# Patient Record
Sex: Female | Born: 1975
Health system: Southern US, Community
[De-identification: ages and names within clinical notes are randomized; demographics above are authoritative.]

## PROBLEM LIST (undated history)

## (undated) ENCOUNTER — Inpatient Hospital Stay (HOSPITAL_COMMUNITY): Payer: Self-pay

## (undated) DIAGNOSIS — I341 Nonrheumatic mitral (valve) prolapse: Secondary | ICD-10-CM

## (undated) DIAGNOSIS — R011 Cardiac murmur, unspecified: Secondary | ICD-10-CM

## (undated) DIAGNOSIS — T7840XA Allergy, unspecified, initial encounter: Secondary | ICD-10-CM

## (undated) DIAGNOSIS — S92919A Unspecified fracture of unspecified toe(s), initial encounter for closed fracture: Secondary | ICD-10-CM

## (undated) HISTORY — DX: Cardiac murmur, unspecified: R01.1

## (undated) HISTORY — DX: Allergy, unspecified, initial encounter: T78.40XA

## (undated) HISTORY — DX: Unspecified fracture of unspecified toe(s), initial encounter for closed fracture: S92.919A

## (undated) HISTORY — PX: WISDOM TOOTH EXTRACTION: SHX21

---

## 2005-10-20 ENCOUNTER — Other Ambulatory Visit: Admission: RE | Admit: 2005-10-20 | Discharge: 2005-10-20 | Payer: Self-pay | Admitting: Obstetrics and Gynecology

## 2009-12-18 ENCOUNTER — Inpatient Hospital Stay (HOSPITAL_COMMUNITY): Admission: AD | Admit: 2009-12-18 | Discharge: 2009-12-21 | Payer: Self-pay | Admitting: Obstetrics and Gynecology

## 2010-01-13 ENCOUNTER — Emergency Department (HOSPITAL_BASED_OUTPATIENT_CLINIC_OR_DEPARTMENT_OTHER): Admission: EM | Admit: 2010-01-13 | Discharge: 2010-01-13 | Payer: Self-pay | Admitting: Emergency Medicine

## 2010-02-12 ENCOUNTER — Encounter: Admission: RE | Admit: 2010-02-12 | Discharge: 2010-03-14 | Payer: Self-pay | Admitting: Obstetrics and Gynecology

## 2010-03-15 ENCOUNTER — Encounter: Admission: RE | Admit: 2010-03-15 | Discharge: 2010-04-14 | Payer: Self-pay | Admitting: Obstetrics and Gynecology

## 2010-04-15 ENCOUNTER — Encounter: Admission: RE | Admit: 2010-04-15 | Discharge: 2010-05-15 | Payer: Self-pay | Admitting: Obstetrics and Gynecology

## 2010-05-16 ENCOUNTER — Encounter: Admission: RE | Admit: 2010-05-16 | Discharge: 2010-06-15 | Payer: Self-pay | Admitting: Obstetrics and Gynecology

## 2010-06-16 ENCOUNTER — Encounter: Admission: RE | Admit: 2010-06-16 | Discharge: 2010-06-30 | Payer: Self-pay | Admitting: Obstetrics and Gynecology

## 2010-07-17 ENCOUNTER — Encounter: Admission: RE | Admit: 2010-07-17 | Discharge: 2010-08-16 | Payer: Self-pay | Admitting: Obstetrics and Gynecology

## 2010-08-17 ENCOUNTER — Encounter
Admission: RE | Admit: 2010-08-17 | Discharge: 2010-09-16 | Payer: Self-pay | Source: Home / Self Care | Admitting: Obstetrics and Gynecology

## 2010-09-17 ENCOUNTER — Encounter
Admission: RE | Admit: 2010-09-17 | Discharge: 2010-09-28 | Payer: Self-pay | Source: Home / Self Care | Attending: Obstetrics and Gynecology | Admitting: Obstetrics and Gynecology

## 2010-12-30 LAB — DIFFERENTIAL
Basophils Absolute: 0 10*3/uL (ref 0.0–0.1)
Basophils Relative: 0 % (ref 0–1)
Monocytes Absolute: 0.2 10*3/uL (ref 0.1–1.0)
Neutro Abs: 7.1 10*3/uL (ref 1.7–7.7)
Neutrophils Relative %: 82 % — ABNORMAL HIGH (ref 43–77)

## 2010-12-30 LAB — URINE MICROSCOPIC-ADD ON

## 2010-12-30 LAB — URINALYSIS, ROUTINE W REFLEX MICROSCOPIC
Bilirubin Urine: NEGATIVE
Hgb urine dipstick: NEGATIVE
Nitrite: NEGATIVE
Specific Gravity, Urine: 1.017 (ref 1.005–1.030)
Urobilinogen, UA: 0.2 mg/dL (ref 0.0–1.0)
pH: 8 (ref 5.0–8.0)

## 2010-12-30 LAB — LIPASE, BLOOD: Lipase: 101 U/L (ref 23–300)

## 2010-12-30 LAB — CBC
HCT: 35.5 % — ABNORMAL LOW (ref 36.0–46.0)
Hemoglobin: 11.7 g/dL — ABNORMAL LOW (ref 12.0–15.0)
MCV: 79.9 fL (ref 78.0–100.0)
WBC: 8.6 10*3/uL (ref 4.0–10.5)

## 2010-12-30 LAB — COMPREHENSIVE METABOLIC PANEL
Alkaline Phosphatase: 78 U/L (ref 39–117)
BUN: 11 mg/dL (ref 6–23)
CO2: 25 mEq/L (ref 19–32)
Chloride: 109 mEq/L (ref 96–112)
Creatinine, Ser: 0.8 mg/dL (ref 0.4–1.2)
GFR calc non Af Amer: >60 mL/min (ref >60)
Glucose, Bld: 101 mg/dL — ABNORMAL HIGH (ref 70–99)
Potassium: 4.1 mEq/L (ref 3.5–5.1)
Total Bilirubin: 0.3 mg/dL (ref 0.3–1.2)

## 2010-12-30 LAB — URINE CULTURE

## 2011-01-04 LAB — RPR: RPR Ser Ql: NONREACTIVE

## 2011-01-04 LAB — CBC
HCT: 27.8 % — ABNORMAL LOW (ref 36.0–46.0)
MCHC: 33.2 g/dL (ref 30.0–36.0)
Platelets: 172 10*3/uL (ref 150–400)
RBC: 3.38 MIL/uL — ABNORMAL LOW (ref 3.87–5.11)
RBC: 4.83 MIL/uL (ref 3.87–5.11)
WBC: 14.6 10*3/uL — ABNORMAL HIGH (ref 4.0–10.5)
WBC: 9.1 10*3/uL (ref 4.0–10.5)

## 2011-06-18 ENCOUNTER — Inpatient Hospital Stay (HOSPITAL_COMMUNITY): Payer: BC Managed Care – PPO

## 2011-06-18 ENCOUNTER — Inpatient Hospital Stay (HOSPITAL_COMMUNITY)
Admission: AD | Admit: 2011-06-18 | Discharge: 2011-06-18 | Disposition: A | Discharge status: Home / Self Care | Payer: BC Managed Care – PPO | Source: Ambulatory Visit | Attending: Family Medicine | Admitting: Family Medicine

## 2011-06-18 ENCOUNTER — Encounter (HOSPITAL_COMMUNITY): Payer: Self-pay | Admitting: *Deleted

## 2011-06-18 DIAGNOSIS — O200 Threatened abortion: Secondary | ICD-10-CM

## 2011-06-18 DIAGNOSIS — I059 Rheumatic mitral valve disease, unspecified: Secondary | ICD-10-CM | POA: Insufficient documentation

## 2011-06-18 DIAGNOSIS — I2510 Atherosclerotic heart disease of native coronary artery without angina pectoris: Secondary | ICD-10-CM | POA: Insufficient documentation

## 2011-06-18 HISTORY — DX: Nonrheumatic mitral (valve) prolapse: I34.1

## 2011-06-18 LAB — WET PREP, GENITAL
Trich, Wet Prep: NONE SEEN
Yeast Wet Prep HPF POC: NONE SEEN

## 2011-06-18 LAB — CBC
MCH: 27.5 pg (ref 26.0–34.0)
MCV: 80.2 fL (ref 78.0–100.0)
Platelets: 180 10*3/uL (ref 150–400)
RDW: 13.4 % (ref 11.5–15.5)
WBC: 5.8 10*3/uL (ref 4.0–10.5)

## 2011-06-18 NOTE — ED Provider Notes (Signed)
History     Chief Complaint  Patient presents with  . Vaginal Bleeding   HPI  Pt here with report of cramping that started yesterday.  Denies unilateral pain.  +discharge, with blood specks seen, no active bleeding.    Past Medical History  Diagnosis Date  . Mitral valve prolapse     Past Surgical History  Procedure Date  . Wisdom tooth extraction     No family history on file.  History  Substance Use Topics  . Smoking status: Never Smoker   . Smokeless tobacco: Never Used  . Alcohol Use: No    Allergies: No Known Allergies  Prescriptions prior to admission  Medication Sig Dispense Refill  . Flaxseed, Linseed, (FLAX SEED OIL PO) Take 1 tablet by mouth daily.        . Multiple Vitamin (MULTIVITAMIN) capsule Take 1 capsule by mouth daily.          Review of Systems  Constitutional: Negative.   Gastrointestinal: Positive for abdominal pain (cramping). Negative for nausea and vomiting.       Blood tinged discharge  Genitourinary: Negative.    Physical Exam   Blood pressure 123/69, pulse 78, temperature 97.8 F (36.6 C), temperature source Oral, resp. rate 18, height 5\' 3"  (1.6 m), weight 72.576 kg (160 lb), last menstrual period 05/05/2011, unknown if currently breastfeeding.  Physical Exam  Constitutional: She is oriented to person, place, and time. She appears well-developed and well-nourished.  HENT:  Head: Normocephalic.  Neck: Normal range of motion. Neck supple.  Cardiovascular: Normal rate, regular rhythm and normal heart sounds.  Exam reveals no gallop and no friction rub.   No murmur heard. Respiratory: Effort normal and breath sounds normal. No respiratory distress.  GI: Soft. She exhibits no mass. There is tenderness (suprapubic). There is no rebound and no guarding.  Genitourinary: There is bleeding around the vagina.       Cervix open; blood tinged dc, no active bleeding  Neurological: She is alert and oriented to person, place, and time.  Skin:  Skin is warm and dry.    MAU Course  Procedures [place] BHCG Wet Prep GC/CT ABO/RH   Assessment and Plan  Threatened Miscarriage  Plan: Consulted with Dr. Ambrose Mantle DC to home F/U for repeat BHCG on 06/21/11 in office  [facility] 06/18/2011, 11:09 AM

## 2011-06-18 NOTE — ED Notes (Signed)
Pt. to follow-up with OBGYN office on Monday. Patient worried and discouraged with report today, but willing to wait 3 days for further information.

## 2011-06-18 NOTE — Progress Notes (Signed)
Cramping started yesterday, worse today. Last night and this morning, noted some yellowish brown "particles" mixed with some flecks of blood. Thinks it is tissue that she is passing from vagina. Has had pregnancy confirmed at MD office, but no U/S yet. States she does not feel pregnancy sxs like she did with lst pregnancy.

## 2011-06-18 NOTE — ED Notes (Signed)
Cervix slightly open, W. Muhammed discussed plan of care.

## 2011-06-18 NOTE — Consult Note (Signed)
Reviewed with Dr. Ambrose Mantle HPI/exam/us>ok for repeat BHCG on Monday 06/21/11

## 2011-11-22 ENCOUNTER — Other Ambulatory Visit: Payer: Self-pay | Admitting: Obstetrics and Gynecology

## 2011-11-22 DIAGNOSIS — N6459 Other signs and symptoms in breast: Secondary | ICD-10-CM

## 2011-11-29 ENCOUNTER — Ambulatory Visit
Admission: RE | Admit: 2011-11-29 | Discharge: 2011-11-29 | Disposition: A | Discharge status: Home / Self Care | Payer: BC Managed Care – PPO | Source: Ambulatory Visit | Attending: Obstetrics and Gynecology | Admitting: Obstetrics and Gynecology

## 2011-11-29 ENCOUNTER — Other Ambulatory Visit: Payer: Self-pay | Admitting: Obstetrics and Gynecology

## 2011-11-29 DIAGNOSIS — N6459 Other signs and symptoms in breast: Secondary | ICD-10-CM

## 2012-08-02 ENCOUNTER — Other Ambulatory Visit: Payer: Self-pay | Admitting: Obstetrics and Gynecology

## 2012-08-02 DIAGNOSIS — N63 Unspecified lump in unspecified breast: Secondary | ICD-10-CM

## 2012-08-07 ENCOUNTER — Ambulatory Visit
Admission: RE | Admit: 2012-08-07 | Discharge: 2012-08-07 | Disposition: A | Discharge status: Home / Self Care | Payer: BC Managed Care – PPO | Source: Ambulatory Visit | Attending: Obstetrics and Gynecology | Admitting: Obstetrics and Gynecology

## 2012-08-07 DIAGNOSIS — N63 Unspecified lump in unspecified breast: Secondary | ICD-10-CM

## 2013-10-11 NOTE — L&D Delivery Note (Signed)
Delivery Note At 8:49 PM a viable and healthy female was delivered via Vaginal, Spontaneous Delivery (Presentation: ; Occiput Posterior).  APGAR: 9, 9; weight P.   Placenta status: Intact, Spontaneous.  Cord: 3 vessels with the following complications: Double Knot.   Anesthesia: Epidural  Episiotomy: none  Lacerations: 2nd degree laceration Suture Repair: 3.0 vicryl rapide Est. Blood Loss (mL): 400cc  Mom to postpartum.  Baby to Couplet care / Skin to Skin.  Bovard-Stuckert, Joliene Salvador 06/08/2014, 9:11 PM  Br/ A+/ Rachelle Hora Gabriela Eves for contra/ Tdap in [facility]  D/W pt circumcision with mother and FOB inc r/b/a of circumcision for female infant.

## 2013-11-26 LAB — OB RESULTS CONSOLE HEPATITIS B SURFACE ANTIGEN: HEP B S AG: NEGATIVE

## 2013-11-26 LAB — OB RESULTS CONSOLE ANTIBODY SCREEN: Antibody Screen: NEGATIVE

## 2013-11-26 LAB — OB RESULTS CONSOLE GC/CHLAMYDIA
Chlamydia: NEGATIVE
Gonorrhea: NEGATIVE

## 2013-11-26 LAB — OB RESULTS CONSOLE ABO/RH: RH TYPE: POSITIVE

## 2013-11-26 LAB — OB RESULTS CONSOLE HIV ANTIBODY (ROUTINE TESTING): HIV: NONREACTIVE

## 2013-11-26 LAB — OB RESULTS CONSOLE RPR: RPR: NONREACTIVE

## 2013-11-26 LAB — OB RESULTS CONSOLE RUBELLA ANTIBODY, IGM: Rubella: IMMUNE

## 2014-02-04 ENCOUNTER — Inpatient Hospital Stay (HOSPITAL_BASED_OUTPATIENT_CLINIC_OR_DEPARTMENT_OTHER)
Admission: EM | Admit: 2014-02-04 | Discharge: 2014-02-06 | DRG: 781 | Disposition: A | Discharge status: Home / Self Care | Payer: BC Managed Care – PPO | Attending: Obstetrics and Gynecology | Admitting: Obstetrics and Gynecology

## 2014-02-04 ENCOUNTER — Encounter (HOSPITAL_BASED_OUTPATIENT_CLINIC_OR_DEPARTMENT_OTHER): Payer: Self-pay | Admitting: Emergency Medicine

## 2014-02-04 DIAGNOSIS — I2510 Atherosclerotic heart disease of native coronary artery without angina pectoris: Secondary | ICD-10-CM | POA: Diagnosis present

## 2014-02-04 DIAGNOSIS — Z833 Family history of diabetes mellitus: Secondary | ICD-10-CM

## 2014-02-04 DIAGNOSIS — O99419 Diseases of the circulatory system complicating pregnancy, unspecified trimester: Secondary | ICD-10-CM

## 2014-02-04 DIAGNOSIS — Z8249 Family history of ischemic heart disease and other diseases of the circulatory system: Secondary | ICD-10-CM

## 2014-02-04 DIAGNOSIS — E860 Dehydration: Secondary | ICD-10-CM | POA: Diagnosis present

## 2014-02-04 DIAGNOSIS — O211 Hyperemesis gravidarum with metabolic disturbance: Principal | ICD-10-CM | POA: Diagnosis present

## 2014-02-04 DIAGNOSIS — I059 Rheumatic mitral valve disease, unspecified: Secondary | ICD-10-CM | POA: Diagnosis present

## 2014-02-04 DIAGNOSIS — R112 Nausea with vomiting, unspecified: Secondary | ICD-10-CM | POA: Diagnosis present

## 2014-02-04 DIAGNOSIS — R197 Diarrhea, unspecified: Secondary | ICD-10-CM | POA: Diagnosis present

## 2014-02-04 LAB — URINALYSIS, ROUTINE W REFLEX MICROSCOPIC
BILIRUBIN URINE: NEGATIVE
Glucose, UA: NEGATIVE mg/dL
Hgb urine dipstick: NEGATIVE
Ketones, ur: >80 mg/dL — AB
LEUKOCYTES UA: NEGATIVE
NITRITE: NEGATIVE
PH: 5.5 (ref 5.0–8.0)
Protein, ur: 30 mg/dL — AB
SPECIFIC GRAVITY, URINE: 1.036 — AB (ref 1.005–1.030)
UROBILINOGEN UA: 1 mg/dL (ref 0.0–1.0)

## 2014-02-04 LAB — URINE MICROSCOPIC-ADD ON

## 2014-02-04 MED ORDER — ONDANSETRON HCL 4 MG/2ML IJ SOLN
4.0000 mg | Freq: Once | INTRAMUSCULAR | Status: AC
  Administered 2014-02-04: 4 mg via INTRAVENOUS
  Filled 2014-02-04: qty 2

## 2014-02-04 MED ORDER — SODIUM CHLORIDE 0.9 % IV BOLUS (SEPSIS): 1000.0000 mL | Freq: Once | INTRAVENOUS | Status: AC

## 2014-02-04 MED ORDER — SODIUM CHLORIDE 0.9 % IV BOLUS (SEPSIS)
1000.0000 mL | Freq: Once | INTRAVENOUS | Status: AC
  Administered 2014-02-04 – 2014-02-05 (×2): 1000 mL via INTRAVENOUS

## 2014-02-04 NOTE — ED Provider Notes (Signed)
CSN: [id number]     Arrival date & time 02/04/14  2300 History   First MD Initiated Contact with Patient 02/04/14 2323     Chief Complaint  Patient presents with  . Abdominal Pain  . [redacted] weeks pregnant      (Consider location/radiation/quality/duration/timing/severity/associated sxs/prior Treatment) Patient is a 38 y.o. female presenting with abdominal pain. The history is provided by the patient and medical records. No language interpreter was used.  Abdominal Pain Associated symptoms: diarrhea, nausea and vomiting   Associated symptoms: no chest pain, no constipation, no cough, no dysuria, no fatigue, no fever, no hematuria and no shortness of breath     Sharon Burnett is a 38 y.o. female  [phone] with a hx of mitral valve prolapse and hyperemesis gravidarium presents to the Emergency Department complaining of gradual, persistent, progressively worsening nausea, vomiting and diarrhea onset 8:30PM.  Pt reports her daughter was sick with a GI bug last week and had all these symptoms.  She reports epigastric pain beginning after the vomiting, but denies lower abd pain, cramping or the feeling of contractions.  Pt reports she is [redacted] weeks pregnant; due date 06/24/14.  She reports that she is to be taking diclegis for her morning sickness, but has not been taking it as directed.  She reports OB is Dr[facility] Senaida [facility]. Emesis is NBNB.  Diarrhea is without melena or hematochezia.  No aggravating or alleviating factors.  Pt denies fever, chills, headache, neck pain, chest pain, SOB, weakness, dizziness, syncope, dysuria, hematuria.      Past Medical History  Diagnosis Date  . Mitral valve prolapse    Past Surgical History  Procedure Laterality Date  . Wisdom tooth extraction     No family history on file. History  Substance Use Topics  . Smoking status: Never Smoker   . Smokeless tobacco: Never Used  . Alcohol Use: No   OB History   Grav Para Term Preterm Abortions TAB  SAB Ect Mult Living   3 1 1  0 1 0 1 0 0 1     Review of Systems  Constitutional: Negative for fever, diaphoresis, appetite change, fatigue and unexpected weight change.  HENT: Negative for mouth sores.   Eyes: Negative for visual disturbance.  Respiratory: Negative for cough, chest tightness, shortness of breath and wheezing.   Cardiovascular: Negative for chest pain.  Gastrointestinal: Positive for nausea, vomiting, abdominal pain (epigastric) and diarrhea. Negative for constipation.  Endocrine: Negative for polydipsia, polyphagia and polyuria.  Genitourinary: Negative for dysuria, urgency, frequency and hematuria.  Musculoskeletal: Negative for back pain and neck stiffness.  Skin: Negative for rash.  Allergic/Immunologic: Negative for immunocompromised state.  Neurological: Negative for syncope, light-headedness and headaches.  Hematological: Does not bruise/bleed easily.  Psychiatric/Behavioral: Negative for sleep disturbance. The patient is not nervous/anxious.       Allergies  Review of patient's allergies indicates no known allergies.  Home Medications   Prior to Admission medications   Medication Sig Start Date End Date Taking? Authorizing Provider  Flaxseed, Linseed, (FLAX SEED OIL PO) Take 1 tablet by mouth daily.      Historical Provider, MD  Multiple Vitamin (MULTIVITAMIN) capsule Take 1 capsule by mouth daily.      Historical Provider, MD   BP 119/77  Pulse 110  Temp(Src) 98.2 F (36.8 C) (Oral)  Resp 20  Ht 5\' 3"  (1.6 m)  Wt 160 lb (72.576 kg)  BMI 28.35 kg/m2  SpO2 99%  LMP 09/17/2013 Physical Exam  Nursing note and vitals reviewed. Constitutional: She is oriented to person, place, and time. She appears well-developed and well-nourished. No distress.  Awake, alert, nontoxic appearance  HENT:  Head: Normocephalic and atraumatic.  Mouth/Throat: Oropharynx is clear and moist. No oropharyngeal exudate.  Eyes: Conjunctivae are normal. No scleral icterus.   Neck: Normal range of motion. Neck supple.  Cardiovascular: Regular rhythm, normal heart sounds and intact distal pulses.   No murmur heard. tachycardia  Pulmonary/Chest: Effort normal and breath sounds normal. No respiratory distress. She has no wheezes.  Clear and equal breath sounds  Abdominal: Soft. Bowel sounds are normal. She exhibits no distension and no mass. There is no tenderness. There is no rebound, no guarding and no CVA tenderness.  Gravid uterus Abd otherwise soft and nontender without guarding No palpable contractions BS present No CVA tenderness FHT 147  Musculoskeletal: Normal range of motion. She exhibits no edema.  Neurological: She is alert and oriented to person, place, and time. She exhibits normal muscle tone. Coordination normal.  Speech is clear and goal oriented Moves extremities without ataxia  Skin: Skin is warm and dry. She is not diaphoretic. No erythema.  Psychiatric: She has a normal mood and affect.    ED Course  Procedures (including critical care time) Labs Review Labs Reviewed  URINALYSIS, ROUTINE W REFLEX MICROSCOPIC - Abnormal; Notable for the following:    Color, Urine AMBER (*)    APPearance CLOUDY (*)    Specific Gravity, Urine 1.036 (*)    Ketones, ur >80 (*)    Protein, ur 30 (*)    All other components within normal limits  URINE MICROSCOPIC-ADD ON - Abnormal; Notable for the following:    Squamous Epithelial / LPF FEW (*)    Bacteria, UA FEW (*)    Crystals CA OXALATE CRYSTALS (*)    All other components within normal limits  CBC - Abnormal; Notable for the following:    WBC 16.3 (*)    All other components within normal limits  BASIC METABOLIC PANEL    Imaging Review No results found.   EKG Interpretation None      MDM   Final diagnoses:  Nausea and vomiting   Sharon Burnett presents with ssx consistent with viral gastroenteritis and exposure to same.  Pt with N/V here in the department.  FHT 147 and no  contractions noted on toco initially.  Abd soft and nontender.  Will give fluids, zofran and reassess.  Plan for PO trial after fluid bolus.    1:01 AM Pt give zofran and fluid bolus.  She complains about epigastric "burning" and was given maalox.  Pt now vomiting again.  Will check basic labs and [facility].  Urine with evidence of dehydration as she has greater than >80 keytones and has an increased specific gravity.    1:18 AM Discussed with Dr. Ellyn HackBovard who [facility].  Labs pending.    Sharon ClientHannah Karigan Cloninger, PA-C 02/05/14 [phone]

## 2014-02-04 NOTE — ED Notes (Addendum)
Vomiting and diarrhea x 4 hours. She is [redacted] weeks pregnant. Abdominal pain. Denies vaginal discharge. Her daughter was sick with a GI bug a few days ago.

## 2014-02-05 ENCOUNTER — Encounter (HOSPITAL_COMMUNITY): Payer: Self-pay

## 2014-02-05 DIAGNOSIS — R112 Nausea with vomiting, unspecified: Secondary | ICD-10-CM | POA: Diagnosis present

## 2014-02-05 LAB — CBC
HEMATOCRIT: 40.7 % (ref 36.0–46.0)
HEMOGLOBIN: 14.5 g/dL (ref 12.0–15.0)
MCH: 28.5 pg (ref 26.0–34.0)
MCHC: 35.6 g/dL (ref 30.0–36.0)
MCV: 80.1 fL (ref 78.0–100.0)
Platelets: 207 10*3/uL (ref 150–400)
RBC: 5.08 MIL/uL (ref 3.87–5.11)
RDW: 13.5 % (ref 11.5–15.5)
WBC: 16.3 10*3/uL — ABNORMAL HIGH (ref 4.0–10.5)

## 2014-02-05 LAB — BASIC METABOLIC PANEL
BUN: 10 mg/dL (ref 6–23)
CHLORIDE: 101 meq/L (ref 96–112)
CO2: 23 meq/L (ref 19–32)
Calcium: 9.9 mg/dL (ref 8.4–10.5)
Creatinine, Ser: 0.7 mg/dL (ref 0.50–1.10)
GFR calc Af Amer: >90 mL/min (ref >90)
GFR calc non Af Amer: >90 mL/min (ref >90)
GLUCOSE: 140 mg/dL — AB (ref 70–99)
POTASSIUM: 3.2 meq/L — AB (ref 3.7–5.3)
SODIUM: 139 meq/L (ref 137–147)

## 2014-02-05 MED ORDER — DEXTROSE-NACL 5-0.45 % IV SOLN
INTRAVENOUS | Status: DC
  Administered 2014-02-05 – 2014-02-06 (×4): via INTRAVENOUS

## 2014-02-05 MED ORDER — ALUM & MAG HYDROXIDE-SIMETH 200-200-20 MG/5ML PO SUSP
30.0000 mL | Freq: Once | ORAL | Status: AC
Start: 2014-02-05 — End: 2014-02-05
  Administered 2014-02-05: 30 mL via ORAL
  Filled 2014-02-05: qty 30

## 2014-02-05 MED ORDER — CALCIUM CARBONATE ANTACID 500 MG PO CHEW: 2.0000 | CHEWABLE_TABLET | ORAL | Status: DC | PRN

## 2014-02-05 MED ORDER — METOCLOPRAMIDE HCL 10 MG PO TABS
10.0000 mg | ORAL_TABLET | Freq: Three times a day (TID) | ORAL | Status: DC
  Administered 2014-02-05 – 2014-02-06 (×4): 10 mg via ORAL
  Filled 2014-02-05 (×4): qty 1

## 2014-02-05 MED ORDER — PANTOPRAZOLE SODIUM 40 MG PO TBEC
40.0000 mg | DELAYED_RELEASE_TABLET | Freq: Every day | ORAL | Status: DC
  Administered 2014-02-05 – 2014-02-06 (×2): 40 mg via ORAL
  Filled 2014-02-05 (×2): qty 1

## 2014-02-05 MED ORDER — DOCUSATE SODIUM 100 MG PO CAPS
100.0000 mg | ORAL_CAPSULE | Freq: Every day | ORAL | Status: DC
  Filled 2014-02-05: qty 1

## 2014-02-05 MED ORDER — ZOLPIDEM TARTRATE 5 MG PO TABS: 5.0000 mg | ORAL_TABLET | Freq: Every evening | ORAL | Status: DC | PRN

## 2014-02-05 MED ORDER — M.V.I. ADULT IV INJ
Freq: Once | INTRAVENOUS | Status: AC
  Administered 2014-02-05: 07:00:00 via INTRAVENOUS
  Filled 2014-02-05: qty 1000

## 2014-02-05 MED ORDER — ACETAMINOPHEN 325 MG PO TABS: 650.0000 mg | ORAL_TABLET | Freq: Four times a day (QID) | ORAL | Status: DC | PRN

## 2014-02-05 MED ORDER — PROMETHAZINE HCL 25 MG/ML IJ SOLN
12.5000 mg | Freq: Four times a day (QID) | INTRAMUSCULAR | Status: DC | PRN
  Administered 2014-02-05 (×2): 12.5 mg via INTRAVENOUS
  Filled 2014-02-05 (×2): qty 1

## 2014-02-05 NOTE — Progress Notes (Addendum)
Patient ID: Sharon Burnett, female   DOB: January 14, 1976, 38 y.o.   MRN: [id number]  Feeling poorly still, but better.  Still with N/V/D  AFVSS gen NAD CV RRR Lungs CTAB Abd soft, NT, ND, + BS Ext sym, NT  FHTs 155    37yo G4P1021 at 20+ with GI bug IV hydration Phenergan and Diclegis prn protonix TED hose Enteric precautions D/c when improving

## 2014-02-05 NOTE — H&P (Signed)
Sharon Burnett is a 38 y.o. female presenting with N/V/D, exposed to GI bug.  Pregnancy has been uncomplicated thus far.    Maternal Medical History:  Reason for admission: Nausea.   Fetal activity: Perceived fetal activity is normal.    Prenatal complications: no prenatal complications   OB History   Grav Para Term Preterm Abortions TAB SAB Ect Mult Living   3 1 1  0 1 0 1 0 0 1    G4P1021 G1 SAB G2 40wk 6#15oz female G3 SAB G4 present No STD  Past Medical History  Diagnosis Date  . Mitral valve prolapse    Past Surgical History  Procedure Laterality Date  . Wisdom tooth extraction     Family History: colon CA, CAD, DM, CHF Social History:  reports that she has never smoked. She has never used smokeless tobacco. She reports that she does not drink alcohol or use illicit drugs.married Meds PNV, Diclegis All NKDA   Prenatal Transfer Tool  Maternal Diabetes: No early in care Genetic Screening: Normal Maternal Ultrasounds/Referrals: Abnormal:  Findings:   Isolated EIF (echogenic intracardiac focus) Fetal Ultrasounds or other Referrals:  None Maternal Substance Abuse:  No Significant Maternal Medications:  None Significant Maternal Lab Results:  None Other Comments:  Panorama Low Risk, exposed to GI  Review of Systems  Constitutional: Positive for malaise/fatigue.  HENT: Negative.   Eyes: Negative.   Respiratory: Negative.   Cardiovascular: Negative.   Gastrointestinal: Positive for nausea, vomiting, abdominal pain and diarrhea.  Genitourinary: Negative.   Musculoskeletal: Negative.   Skin: Negative.   Neurological: Negative.   Psychiatric/Behavioral: Negative.       Blood pressure 117/63, pulse 95, temperature 98.2 F (36.8 C), temperature source Oral, resp. rate 18, height 5\' 3"  (1.6 m), weight 72.576 kg (160 lb), last menstrual period 09/17/2013, SpO2 100.00%, unknown if currently breastfeeding. Maternal Exam:  Abdomen: Fundal height is appropriate for  gestation.    Introitus: Normal vulva. Normal vagina.    Physical Exam  Constitutional: She is oriented to person, place, and time. She appears well-developed and well-nourished.  HENT:  Head: Normocephalic and atraumatic.  Cardiovascular: Normal rate and regular rhythm.   Respiratory: Effort normal and breath sounds normal. No respiratory distress. She has no wheezes.  GI: Soft. Bowel sounds are normal. She exhibits no distension. There is tenderness.  Musculoskeletal: Normal range of motion.  Neurological: She is alert and oriented to person, place, and time.  Skin: Skin is warm and dry.  Psychiatric: She has a normal mood and affect. Her behavior is normal.    Prenatal labs: ABO, Rh:  A+ Antibody:  neg Rubella:  immune RPR:   NR HBsAg:   neg HIV:   neg GBS:   unknown  Hgb 14.1/Ur Cx neg/Varicella Immune/CF neg/ AFP WNL/Panorama Low Risk female/Plt 248K EDC 06/24/14  Anat nl anat, cwd, female, isolated EIF  Assessment/Plan: 38yo [id number] at 620+ with N/V, exposed to GI virus D51/2 NS at 125cc/hr Diclegis Reglan    Sharon Burnett 02/05/2014, 2:52 AM

## 2014-02-05 NOTE — ED Notes (Signed)
Pt given ginger ale to PO challenge 

## 2014-02-05 NOTE — Progress Notes (Signed)
Patient ID: Sharon FallenJennifer Burnett, female   DOB: Jan 16, 1976, 38 y.o.   MRN: [id number] Pt reports decreased frequency of vomiting and diarrhea. She thinks that over the last 14-16 hours she has had < 5 episodes of vomiting and diarrhea.She has tried some po but very little. She is not ready for D/C Her abdomen is soft and not tender

## 2014-02-05 NOTE — ED Notes (Signed)
OB rapid response called.  Olegario MessierKathy, RN suggests that we doppler and spot check FHT.

## 2014-02-05 NOTE — ED Provider Notes (Signed)
Medical screening examination/treatment/procedure(s) were performed by non-physician practitioner and as supervising physician I was immediately available for consultation/collaboration.   EKG Interpretation None       Sharon Los K Makyi Ledo-Rasch, MD 02/05/14 [phone]

## 2014-02-06 MED ORDER — PANTOPRAZOLE SODIUM 40 MG PO TBEC: 40.0000 mg | DELAYED_RELEASE_TABLET | Freq: Every day | ORAL | Status: DC

## 2014-02-06 NOTE — Discharge Summary (Signed)
Physician Discharge Summary  Patient ID: Sharon Burnett MRN: [id number] DOB/AGE: 39-Sep-1977 39 y.o.  Admit date: 02/04/2014 Discharge date: 02/06/2014  Admission Diagnoses: Nausea/ vomiting                                         Diarrhea                                         Dehydration  Discharge Diagnoses:  Active Problems:   N&V (nausea and vomiting)   Nausea and vomiting   Discharged Condition: good  Hospital Course: Pt was admitted and supported by IV fluids for the course of her GI illness.  On the morning of d/c, nausea and vomiting had resolved and she had improved diarrhea.  She was able to tolerate po intake.   Significant Diagnostic Studies: labs: CBC  Treatments :IV fluidsIV hydration  Discharge Exam: Blood pressure 91/51, pulse 70, temperature 97.3 F (36.3 C), temperature source Oral, resp. rate 18, height 5\' 3"  (1.6 m), weight 78.472 kg (173 lb), last menstrual period 09/17/2013, SpO2 98.00%, unknown if currently breastfeeding. General appearance: alert and cooperative Abdomen soft and NT  Disposition: 01-Home or Self Care  Discharge Orders   Future Orders Complete By Expires   Diet - low sodium heart healthy  As directed    Discharge instructions  As directed    Increase activity slowly  As directed        Medication List         FLAX SEED OIL PO  Take 1 tablet by mouth daily.     multivitamin capsule  Take 1 capsule by mouth daily.     pantoprazole 40 MG tablet  Commonly known as:  PROTONIX  Take 1 tablet (40 mg total) by mouth daily.           Follow-up Information   Follow up with Oliver PilaICHARDSON,Darcy Cordner W, MD In 2 weeks. (as scheduled)    Specialty:  Obstetrics and Gynecology   Contact information:   [address], [address] [place] [id number] [phone]       Signed: Oliver PilaKathy W Keierra Nudo 02/06/2014, 9:20 AM

## 2014-02-06 NOTE — Progress Notes (Signed)
Pt tolerated breakfast with no vomiting... Pt discharged home with husband... Condition stable... No equipment... Ambulated to car with Stevphen MeuseV. Pittman, NT.

## 2014-02-06 NOTE — Progress Notes (Signed)
Patient ID: Sharon FallenJennifer Burnett, female   DOB: 1976/04/01, 38 y.o.   MRN: [id number] Pt feeling much better.  No vomiting in 12 hours, still some lingering diarrhea, but improved.  Trying to eat this morning and no emesis yet  Feeling better, if tolerates breakfast, will d/c home later this AM.

## 2014-03-11 DIAGNOSIS — S92919A Unspecified fracture of unspecified toe(s), initial encounter for closed fracture: Secondary | ICD-10-CM

## 2014-03-11 HISTORY — DX: Unspecified fracture of unspecified toe(s), initial encounter for closed fracture: S92.919A

## 2014-05-23 LAB — OB RESULTS CONSOLE GBS: STREP GROUP B AG: NEGATIVE

## 2014-06-08 ENCOUNTER — Inpatient Hospital Stay (HOSPITAL_COMMUNITY)
Admission: AD | Admit: 2014-06-08 | Discharge: 2014-06-10 | DRG: 774 | Disposition: A | Discharge status: Home / Self Care | Payer: BC Managed Care – PPO | Source: Ambulatory Visit | Attending: Obstetrics and Gynecology | Admitting: Obstetrics and Gynecology

## 2014-06-08 ENCOUNTER — Inpatient Hospital Stay (HOSPITAL_COMMUNITY): Payer: BC Managed Care – PPO

## 2014-06-08 ENCOUNTER — Encounter (HOSPITAL_COMMUNITY): Payer: Self-pay | Admitting: *Deleted

## 2014-06-08 ENCOUNTER — Inpatient Hospital Stay (HOSPITAL_COMMUNITY): Payer: BC Managed Care – PPO | Admitting: Anesthesiology

## 2014-06-08 ENCOUNTER — Encounter (HOSPITAL_COMMUNITY): Payer: BC Managed Care – PPO | Admitting: Anesthesiology

## 2014-06-08 DIAGNOSIS — O4100X0 Oligohydramnios, unspecified trimester, not applicable or unspecified: Secondary | ICD-10-CM | POA: Diagnosis not present

## 2014-06-08 DIAGNOSIS — O4103X1 Oligohydramnios, third trimester, fetus 1: Secondary | ICD-10-CM

## 2014-06-08 DIAGNOSIS — O368190 Decreased fetal movements, unspecified trimester, not applicable or unspecified: Secondary | ICD-10-CM | POA: Diagnosis present

## 2014-06-08 DIAGNOSIS — I2510 Atherosclerotic heart disease of native coronary artery without angina pectoris: Secondary | ICD-10-CM | POA: Diagnosis present

## 2014-06-08 DIAGNOSIS — O3090 Multiple gestation, unspecified, unspecified trimester: Secondary | ICD-10-CM | POA: Diagnosis not present

## 2014-06-08 DIAGNOSIS — O09529 Supervision of elderly multigravida, unspecified trimester: Secondary | ICD-10-CM | POA: Diagnosis present

## 2014-06-08 DIAGNOSIS — O368131 Decreased fetal movements, third trimester, fetus 1: Secondary | ICD-10-CM

## 2014-06-08 DIAGNOSIS — O9942 Diseases of the circulatory system complicating childbirth: Secondary | ICD-10-CM

## 2014-06-08 DIAGNOSIS — I059 Rheumatic mitral valve disease, unspecified: Secondary | ICD-10-CM | POA: Diagnosis present

## 2014-06-08 DIAGNOSIS — Z9289 Personal history of other medical treatment: Secondary | ICD-10-CM

## 2014-06-08 LAB — RPR

## 2014-06-08 LAB — CBC
HCT: 35.5 % — ABNORMAL LOW (ref 36.0–46.0)
Hemoglobin: 12.3 g/dL (ref 12.0–15.0)
MCH: 27.1 pg (ref 26.0–34.0)
MCHC: 34.6 g/dL (ref 30.0–36.0)
MCV: 78.2 fL (ref 78.0–100.0)
PLATELETS: 177 10*3/uL (ref 150–400)
RBC: 4.54 MIL/uL (ref 3.87–5.11)
RDW: 13 % (ref 11.5–15.5)
WBC: 8.3 10*3/uL (ref 4.0–10.5)

## 2014-06-08 MED ORDER — TERBUTALINE SULFATE 1 MG/ML IJ SOLN: 0.2500 mg | Freq: Once | INTRAMUSCULAR | Status: AC | PRN

## 2014-06-08 MED ORDER — LACTATED RINGERS IV SOLN
500.0000 mL | Freq: Once | INTRAVENOUS | Status: DC
Start: 2014-06-08 — End: 2014-06-10

## 2014-06-08 MED ORDER — LACTATED RINGERS IV SOLN: 500.0000 mL | INTRAVENOUS | Status: DC | PRN

## 2014-06-08 MED ORDER — LIDOCAINE HCL (PF) 1 % IJ SOLN
30.0000 mL | INTRAMUSCULAR | Status: DC | PRN
  Filled 2014-06-08: qty 30

## 2014-06-08 MED ORDER — PHENYLEPHRINE 40 MCG/ML (10ML) SYRINGE FOR IV PUSH (FOR BLOOD PRESSURE SUPPORT)
80.0000 ug | PREFILLED_SYRINGE | INTRAVENOUS | Status: DC | PRN
  Filled 2014-06-08: qty 2
  Filled 2014-06-08: qty 10

## 2014-06-08 MED ORDER — OXYTOCIN 40 UNITS IN LACTATED RINGERS INFUSION - SIMPLE MED
62.5000 mL/h | INTRAVENOUS | Status: DC
  Filled 2014-06-08: qty 1000

## 2014-06-08 MED ORDER — ACETAMINOPHEN 325 MG PO TABS
650.0000 mg | ORAL_TABLET | ORAL | Status: DC | PRN
  Administered 2014-06-08: 650 mg via ORAL
  Filled 2014-06-08: qty 2

## 2014-06-08 MED ORDER — IBUPROFEN 600 MG PO TABS: 600.0000 mg | ORAL_TABLET | Freq: Four times a day (QID) | ORAL | Status: DC | PRN

## 2014-06-08 MED ORDER — EPHEDRINE 5 MG/ML INJ
10.0000 mg | INTRAVENOUS | Status: DC | PRN
  Filled 2014-06-08: qty 2
  Filled 2014-06-08: qty 4

## 2014-06-08 MED ORDER — PHENYLEPHRINE 40 MCG/ML (10ML) SYRINGE FOR IV PUSH (FOR BLOOD PRESSURE SUPPORT)
80.0000 ug | PREFILLED_SYRINGE | INTRAVENOUS | Status: DC | PRN
  Filled 2014-06-08: qty 2

## 2014-06-08 MED ORDER — FENTANYL 2.5 MCG/ML BUPIVACAINE 1/10 % EPIDURAL INFUSION (WH - ANES)
INTRAMUSCULAR | Status: DC | PRN
  Administered 2014-06-08: 14 mL/h via EPIDURAL

## 2014-06-08 MED ORDER — LACTATED RINGERS IV SOLN: INTRAVENOUS | Status: DC

## 2014-06-08 MED ORDER — OXYTOCIN BOLUS FROM INFUSION
500.0000 mL | INTRAVENOUS | Status: DC
  Administered 2014-06-08: 500 mL via INTRAVENOUS

## 2014-06-08 MED ORDER — FENTANYL 2.5 MCG/ML BUPIVACAINE 1/10 % EPIDURAL INFUSION (WH - ANES)
14.0000 mL/h | INTRAMUSCULAR | Status: DC | PRN
  Filled 2014-06-08: qty 125

## 2014-06-08 MED ORDER — OXYCODONE-ACETAMINOPHEN 5-325 MG PO TABS: 1.0000 | ORAL_TABLET | ORAL | Status: DC | PRN

## 2014-06-08 MED ORDER — FLEET ENEMA 7-19 GM/118ML RE ENEM: 1.0000 | ENEMA | RECTAL | Status: DC | PRN

## 2014-06-08 MED ORDER — CITRIC ACID-SODIUM CITRATE 334-500 MG/5ML PO SOLN
30.0000 mL | ORAL | Status: DC | PRN
  Filled 2014-06-08: qty 15

## 2014-06-08 MED ORDER — ONDANSETRON HCL 4 MG/2ML IJ SOLN: 4.0000 mg | Freq: Four times a day (QID) | INTRAMUSCULAR | Status: DC | PRN

## 2014-06-08 MED ORDER — EPHEDRINE 5 MG/ML INJ
10.0000 mg | INTRAVENOUS | Status: DC | PRN
  Filled 2014-06-08: qty 2

## 2014-06-08 MED ORDER — LACTATED RINGERS IV SOLN
INTRAVENOUS | Status: DC
  Administered 2014-06-08 (×2): via INTRAVENOUS

## 2014-06-08 MED ORDER — OXYTOCIN 40 UNITS IN LACTATED RINGERS INFUSION - SIMPLE MED
1.0000 m[IU]/min | INTRAVENOUS | Status: DC
  Administered 2014-06-08: 2 m[IU]/min via INTRAVENOUS

## 2014-06-08 MED ORDER — LIDOCAINE HCL (PF) 1 % IJ SOLN
INTRAMUSCULAR | Status: DC | PRN
  Administered 2014-06-08 (×2): 5 mL
  Administered 2014-06-08: 3 mL

## 2014-06-08 MED ORDER — DIPHENHYDRAMINE HCL 50 MG/ML IJ SOLN: 12.5000 mg | INTRAMUSCULAR | Status: DC | PRN

## 2014-06-08 MED ORDER — LACTATED RINGERS IV SOLN
INTRAVENOUS | Status: DC
  Administered 2014-06-08: 18:00:00 via INTRAUTERINE

## 2014-06-08 NOTE — Anesthesia Preprocedure Evaluation (Signed)

## 2014-06-08 NOTE — MAU Provider Note (Signed)
  History     CSN: [id number]  Arrival date and time: 06/08/14 [facility]   None     Chief Complaint  Patient presents with  . Decreased Fetal Movement   HPI [name] is a 38 y.o. female [phone] at [redacted]w[redacted]d who presents with decreased fetal movement. The patient had an NST done Thursday in the office; as far as she knows the NST was normal.  Yesterday she noticed a decrease in the movements;  Pt did not sleep well last night because she was trying to get the baby to move. Since arrival to MAU pt has felt the baby move twice.  Pt took diclegis and 0900, which is the only medication she has taken today. Denies vaginal bleeding, or leaking of fluid.    OB History   Grav Para Term Preterm Abortions TAB SAB Ect Mult Living   0 2 0 2 0 0 1      Past Medical History  Diagnosis Date  . Mitral valve prolapse     Past Surgical History  Procedure Laterality Date  . Wisdom tooth extraction      History reviewed. No pertinent family history.  History  Substance Use Topics  . Smoking status: Never Smoker   . Smokeless tobacco: Never Used  . Alcohol Use: No    Allergies: No Known Allergies  Prescriptions prior to admission  Medication Sig Dispense Refill  . calcium carbonate (TUMS - DOSED IN MG ELEMENTAL CALCIUM) 500 MG chewable tablet Chew 1 tablet by mouth daily as needed for indigestion or heartburn.      . Doxylamine-Pyridoxine (DICLEGIS PO) Take 2 tablets by mouth daily.       No results found for this or any previous visit (from the past 48 hour(s)).    Review of Systems  Constitutional: Negative for fever and chills.  Gastrointestinal: Positive for nausea and vomiting. Negative for abdominal pain.  Genitourinary: Negative for dysuria, urgency, frequency and hematuria.       No vaginal discharge. No vaginal bleeding. No dysuria.    Physical Exam   Blood pressure 118/82, pulse 88, temperature 97.9 F (36.6 C), temperature source Oral, resp. rate 18,  height  (1.6 m), weight 81.829 kg (180 lb 6.4 oz), last menstrual period 09/17/2013, unknown if currently breastfeeding.  Physical Exam  Constitutional: She is oriented to person, place, and time. She appears well-developed and well-nourished. No distress.  HENT:  Head: Normocephalic.  Eyes: Pupils are equal, round, and reactive to light.  Neck: Neck supple.  Respiratory: Effort normal.  Musculoskeletal: Normal range of motion.  Neurological: She is alert and oriented to person, place, and time.  Skin: Skin is warm. She is not diaphoretic.  Psychiatric: Her behavior is normal.    Fetal Tracing: Baseline: 145 Variability: minimal; improved variability 30 mins into fetal tracing  Accelerations: 10x10 Decelerations: variable decelerations  Toco: occasional UI  10:00: 15x15 accelerations with moderate variability, no decels    MAU Course  Procedures None  MDM NST  BPP Consulted with Dr. Ellyn Hack and discussed plan of care.  Preliminary [place] report shows BPP 4/8; oligohydramnios. Dr. Ellyn Hack aware; pt to be admitted to Labor and delivery Assessment and Plan   A:  Decreased fetal movement BPP 4/8 Oligohydramnios GBS negative   P:  Admit to Labor and delivery for induction of labor at term gestation per Dr. Kandis Mannan, NP  06/08/2014, 9:34 AM

## 2014-06-08 NOTE — Anesthesia Procedure Notes (Signed)
Epidural Patient location during procedure: OB  Staffing Anesthesiologist: Xachary Hambly Performed by: anesthesiologist   Preanesthetic Checklist Completed: patient identified, site marked, surgical consent, pre-op evaluation, timeout performed, IV checked, risks and benefits discussed and monitors and equipment checked  Epidural Patient position: sitting Prep: ChloraPrep Patient monitoring: heart rate, continuous pulse ox and blood pressure Approach: right paramedian Location: L4-L5 Injection technique: LOR saline  Needle:  Needle type: Tuohy  Needle gauge: 17 G Needle length: 9 cm and 9 Needle insertion depth: 6 cm Catheter type: closed end flexible Catheter size: 20 Guage Catheter at skin depth: 10 cm Test dose: negative  Assessment Events: blood not aspirated, injection not painful, no injection resistance, negative IV test and no paresthesia  Additional Notes   Patient tolerated the insertion well without complications.   

## 2014-06-08 NOTE — MAU Note (Signed)
Pt has experienced decreased fetal movement for 10 hours.  Denies LOF/VB.

## 2014-06-08 NOTE — MAU Note (Signed)
Per Kriste Basque, RN charge birthing suites, pt to go to room 168.

## 2014-06-08 NOTE — H&P (Signed)
Sharon Burnett is a 38 y.o. female G4P1021 at 37+ with decreased fetal movement.  Pt had NR NST, 4/8 BPP.  D/W pt BPP results and IOL.  Relatively uncomplicated PNC, except isolated EIF on anatomy US.  No LOF, no VB, occ ctx.  Pt with N/V in early pregnancy.  Pregnancy dated by LMP c/w 1st trimester screen.    Maternal Medical History:  Contractions: Frequency: irregular.   Perceived severity is moderate.    Fetal activity: Perceived fetal activity is decreased.    Prenatal Complications - Diabetes: none.    OB History   Grav Para Term Preterm Abortions TAB SAB Ect Mult Living   0 2 0 2 0 0 1    G1 SAB G2 SVD 6#15, female G3 SAB G4 present  No abn pap, No STD  Past Medical History  Diagnosis Date  . Mitral valve prolapse    Past Surgical History  Procedure Laterality Date  . Wisdom tooth extraction     Family History: family history is not on file. Social History:  reports that she has never smoked. She has never used smokeless tobacco. She reports that she does not drink alcohol or use illicit drugs.married Meds Protonix, PNV All NKDA   Prenatal Transfer Tool  Maternal Diabetes: No Genetic Screening: Normal Maternal Ultrasounds/Referrals: Normal Fetal Ultrasounds or other Referrals:  None Maternal Substance Abuse:  No Significant Maternal Medications:  None Significant Maternal Lab Results:  Lab values include: Group B Strep negative Other Comments:  EIF L ventricle  Review of Systems  Constitutional: Negative.   Eyes: Negative.   Respiratory: Negative.   Cardiovascular: Negative.   Gastrointestinal: Negative.   Genitourinary: Negative.   Musculoskeletal: Negative.   Skin: Negative.   Neurological: Negative.   Psychiatric/Behavioral: Negative.       Blood pressure 121/76, pulse 74, temperature 98.7 F (37.1 C), temperature source Oral, resp. rate 18, height  (1.6 m), weight 81.829 kg (180 lb 6.4 oz), last menstrual period 09/17/2013, unknown  if currently breastfeeding. Maternal Exam:  Abdomen: Fundal height is appropriate for gestation.   Estimated fetal weight is 6.5-7.5#.   Fetal presentation: vertex  Introitus: Normal vulva. Normal vagina.  Pelvis: adequate for delivery.   Cervix: Cervix evaluated by digital exam.     Physical Exam  Constitutional: She is oriented to person, place, and time. She appears well-developed and well-nourished.  HENT:  Head: Normocephalic and atraumatic.  Cardiovascular: Normal rate and regular rhythm.   Respiratory: Effort normal and breath sounds normal. No respiratory distress. She has no wheezes.  GI: Soft. Bowel sounds are normal. She exhibits no distension. There is no tenderness.  Musculoskeletal: Normal range of motion.  Neurological: She is alert and oriented to person, place, and time.  Skin: Skin is warm and dry.  Psychiatric: She has a normal mood and affect. Her behavior is normal.    Prenatal labs: ABO, Rh: A/Positive/-- (02/16 0000) Antibody: Negative (02/16 0000) Rubella: Immune (02/16 0000) RPR: Nonreactive (02/16 0000)  HBsAg: Negative (02/16 0000)  HIV: Non-reactive (02/16 0000)  GBS: Negative (08/13 0000)   CF neg/Hgb 14.1/Ur Cx neg/ GC neg/ Chl neg/ VI/Pap WNL/ AFP WNL/glucola 97/Plt 248K/ Panorama Low Risk  [place] - Nl anat, x EIF, female, post plac Tdap - 04/03/2014  Assessment/Plan: 16XW  [id number] at 37+  For IOL given 4/8 BPP Epidural prn gbbs neg Expect SVD Close monitoring   Bovard-Stuckert, Sharon Burnett 06/08/2014, 11:55 AM

## 2014-06-08 NOTE — Progress Notes (Signed)
Patient ID: Sharon Burnett, female   DOB: [birth year], 38 y.o.   MRN: [id number]  Getting uncomfortable with ctx. Getting the "shakes"  AFVSS gen NAD, worried FHTs 150-170, mod variability, category 2 - variables toco q 2-76min (relaxing between ctx)  SVE 4.5/70/-2  IUPC placed w/o difficulty/complication  Will get epidural D/w Pt and FOB LTCS for delivery inc r/b/a - inc but not limited to bleeding, infection, damage to surrounding organs, injury to infant and trouble healing.  Questions answered.  Will monitor for noow, but know that his is a possibility.

## 2014-06-08 NOTE — Plan of Care (Signed)
Problem: Phase I Progression Outcomes Goal: Adequate progression of labor Admitted for IOL, BPP 4/8, decreased fetal movement.

## 2014-06-08 NOTE — Progress Notes (Signed)
Patient ID: Sharon Burnett, female   DOB: [birth year], 38 y.o.   MRN: [id number]  SROM for clear/bloody liquid SVE 3/50/-2  FHTs 150's, mod var, category 1 toco irr

## 2014-06-09 LAB — CBC
HCT: 31.9 % — ABNORMAL LOW (ref 36.0–46.0)
HEMOGLOBIN: 10.7 g/dL — AB (ref 12.0–15.0)
MCH: 26 pg (ref 26.0–34.0)
MCHC: 33.5 g/dL (ref 30.0–36.0)
MCV: 77.6 fL — AB (ref 78.0–100.0)
Platelets: 172 10*3/uL (ref 150–400)
RBC: 4.11 MIL/uL (ref 3.87–5.11)
RDW: 12.9 % (ref 11.5–15.5)
WBC: 11.1 10*3/uL — ABNORMAL HIGH (ref 4.0–10.5)

## 2014-06-09 MED ORDER — PRENATAL MULTIVITAMIN CH
1.0000 | ORAL_TABLET | Freq: Every day | ORAL | Status: DC
  Administered 2014-06-09: 1 via ORAL
  Filled 2014-06-09: qty 1

## 2014-06-09 MED ORDER — LANOLIN HYDROUS EX OINT: TOPICAL_OINTMENT | CUTANEOUS | Status: DC | PRN

## 2014-06-09 MED ORDER — BENZOCAINE-MENTHOL 20-0.5 % EX AERO
1.0000 "application " | INHALATION_SPRAY | CUTANEOUS | Status: DC | PRN
  Administered 2014-06-09: 1 via TOPICAL
  Filled 2014-06-09: qty 56

## 2014-06-09 MED ORDER — DIPHENHYDRAMINE HCL 25 MG PO CAPS: 25.0000 mg | ORAL_CAPSULE | Freq: Four times a day (QID) | ORAL | Status: DC | PRN

## 2014-06-09 MED ORDER — LACTATED RINGERS IV SOLN
INTRAVENOUS | Status: DC
Start: 2014-06-09 — End: 2014-06-10

## 2014-06-09 MED ORDER — ZOLPIDEM TARTRATE 5 MG PO TABS: 5.0000 mg | ORAL_TABLET | Freq: Every evening | ORAL | Status: DC | PRN

## 2014-06-09 MED ORDER — DIBUCAINE 1 % RE OINT: 1.0000 "application " | TOPICAL_OINTMENT | RECTAL | Status: DC | PRN

## 2014-06-09 MED ORDER — OXYCODONE-ACETAMINOPHEN 5-325 MG PO TABS: 1.0000 | ORAL_TABLET | ORAL | Status: DC | PRN

## 2014-06-09 MED ORDER — SIMETHICONE 80 MG PO CHEW: 80.0000 mg | CHEWABLE_TABLET | ORAL | Status: DC | PRN

## 2014-06-09 MED ORDER — IBUPROFEN 600 MG PO TABS
600.0000 mg | ORAL_TABLET | Freq: Four times a day (QID) | ORAL | Status: DC
  Filled 2014-06-09 (×2): qty 1

## 2014-06-09 MED ORDER — ONDANSETRON HCL 4 MG PO TABS: 4.0000 mg | ORAL_TABLET | ORAL | Status: DC | PRN

## 2014-06-09 MED ORDER — CALCIUM CARBONATE ANTACID 500 MG PO CHEW: 1.0000 | CHEWABLE_TABLET | Freq: Every day | ORAL | Status: DC | PRN

## 2014-06-09 MED ORDER — SENNOSIDES-DOCUSATE SODIUM 8.6-50 MG PO TABS
2.0000 | ORAL_TABLET | ORAL | Status: DC
  Administered 2014-06-09 (×2): 2 via ORAL
  Filled 2014-06-09 (×2): qty 2

## 2014-06-09 MED ORDER — WITCH HAZEL-GLYCERIN EX PADS: 1.0000 "application " | MEDICATED_PAD | CUTANEOUS | Status: DC | PRN

## 2014-06-09 MED ORDER — ONDANSETRON HCL 4 MG/2ML IJ SOLN: 4.0000 mg | INTRAMUSCULAR | Status: DC | PRN

## 2014-06-09 NOTE — Progress Notes (Addendum)
Post Partum Day 1 Subjective: no complaints, up ad lib, tolerating PO and nl lochia, pain controlled  Objective: Blood pressure 112/70, pulse 65, temperature 98.6 F (37 C), temperature source Oral, resp. rate 16, height  (1.6 m), weight 81.647 kg (180 lb), last menstrual period 09/17/2013, SpO2 98.00%, unknown if currently breastfeeding.  Physical Exam:  General: alert and no distress Lochia: appropriate Uterine Fundus: firm   Recent Labs  06/08/14 1107 06/09/14 0552  HGB 12.3 10.7*  HCT 35.5* 31.9*    Assessment/Plan: Plan for discharge tomorrow, Breastfeeding and Lactation consult.  Routine care.  Baby with sugar issues.  Plan for circ tomorrow.   LOS: 1 day   Bovard-Stuckert, Shaunn Tackitt 06/09/2014, 7:58 AM

## 2014-06-09 NOTE — Lactation Note (Signed)
This note was copied from the chart of Boy Alizah Sills. Lactation Consultation Note  Patient Name: Boy Johan Antonacci [id number] Date: 06/09/2014 Reason for consult: Initial assessment;Infant < 6lbs;Late preterm infant;Other (Comment) (low initial blood sugar=29) now stable and wnl but baby has needed supplement.  Mom exclusively pumped for her older child for 10 months.  This newborn has been both breastfeeding and receiving formula until ebm available because of low blood sugars and weight below 6 lbs at 37 weeks. LPI handout given and reviewed with parents.  Mom was started on DEBP and plan established by her RN, Johnny Bridge was to pre-pump briefly prior to latching bay to breast, breastfeed 15 minutes, then pump 15 minutes and offer 10-15 ml's supplement (ebm or formula).  Mom recently attempted to feed baby both breast and bottle w/formula but he was too sleepy so FOB is holding baby and mom is going to use DEBP.  She feeds any expressed drops to baby and will follow plan for now but would like to resume breastfeeding without pumping as soon as possible because of her hx of previous pumping x 10 months for her daughter.  LC reviewed plan with her night nurse, Annia Belt, RN.  Mom to call for latch assistance or breastfeeding concerns as needed through the night.   Maternal Data Formula Feeding for Exclusion: No Has patient been taught Hand Expression?: Yes (per RN, Cain Saupe) Does the patient have breastfeeding experience prior to this delivery?: Yes  Feeding Feeding Type: Breast Fed  LATCH Score/Interventions           most recent LATCH score=7 per RN assessment           Lactation Tools Discussed/Used   STS, cue feedings at least every 3 hours due to weight <6 pounds LPI handout and special needs DEBP and feeding plan reviewed  Consult Status Consult Status: Follow-up Date: 06/10/14 Follow-up type: In-patient    Warrick Parisian [facility] 06/09/2014, 8:47  PM

## 2014-06-09 NOTE — Anesthesia Postprocedure Evaluation (Signed)
  Anesthesia Post-op Note  Patient: Sharon Burnett  Procedure(s) Performed: * No procedures listed *  Patient Location: Mother/Baby  Anesthesia Type:Epidural  Level of Consciousness: awake and alert   Airway and Oxygen Therapy: Patient Spontanous Breathing  Post-op Pain: mild  Post-op Assessment: Post-op Vital signs reviewed, Patient's Cardiovascular Status Stable, Respiratory Function Stable, No signs of Nausea or vomiting, Pain level controlled, No headache, No residual numbness and No residual motor weakness  Post-op Vital Signs: Reviewed  Last Vitals:  Filed Vitals:   06/09/14 0430  BP: 112/70  Pulse: 65  Temp: 37 C  Resp: 16    Complications: No apparent anesthesia complications

## 2014-06-10 MED ORDER — OXYCODONE-ACETAMINOPHEN 5-325 MG PO TABS: 1.0000 | ORAL_TABLET | Freq: Four times a day (QID) | ORAL | Status: DC | PRN

## 2014-06-10 MED ORDER — PRENATAL MULTIVITAMIN CH: 1.0000 | ORAL_TABLET | Freq: Every day | ORAL | Status: DC

## 2014-06-10 MED ORDER — IBUPROFEN 800 MG PO TABS: 800.0000 mg | ORAL_TABLET | Freq: Three times a day (TID) | ORAL | Status: DC | PRN

## 2014-06-10 NOTE — Progress Notes (Signed)
Post Partum Day 2 Subjective: no complaints, up ad lib, tolerating PO and nl lochia, pain controlled  Objective: Blood pressure 110/70, pulse 78, temperature 98.4 F (36.9 C), temperature source Oral, resp. rate 19, height  (1.6 m), weight 81.647 kg (180 lb), last menstrual period 09/17/2013, SpO2 100.00%, unknown if currently breastfeeding.  Physical Exam:  General: alert and no distress Lochia: appropriate Uterine Fundus: firm   Recent Labs  06/08/14 1107 06/09/14 0552  HGB 12.3 10.7*  HCT 35.5* 31.9*    Assessment/Plan: Discharge home, Breastfeeding and Lactation consult.  Routine care.  D/C with Motrin, percocet and PNV, f/u 6 weeks   LOS: 2 days   Bovard-Stuckert, Glennette Galster 06/10/2014, 7:38 AM

## 2014-06-10 NOTE — Lactation Note (Signed)
This note was copied from the chart of Sharon Senie Lanese. Lactation Consultation Note Requested by mom to assist in difficult latch and baby having low blood sugars. Mom has been doing STS, used DEBP and stated only got out drops of colostrum. Explained not to get discouraged, hand expression done, saw colostrum, explained about thickness and stimulation of breast was good. Mom disappointed that baby's blood sugar was low and had to give formula. Cried stated she was upset that she couldn't feed her baby and give him what he needed. Mom stated she had big nipples and baby had a little mouth. Mom was sitting in recliner BF baby in football position doing STS during feeding using good positioning. On assessment of latch, baby had good flange. Mom stated she finally got him latched but it hurts some and didn't think the baby is able to obtain a deep latch d/t her nipples. Mom has short shafts, the nipple fits in her mouth fine. Reviewed pg. 24 in Baby and Me book on proper latches and getting good milk transfer. Offered NS. Explained to try that and wear shells in bra to train nipples to evert more for a deeper latch. Mom agreed. D/T moms breast tissue pendulum shaped and slightly soft, when baby latches w/NS, it bobes in and out, encouraged to hold breast w/"C" hold for firm support behind the nipple until latched well. Baby was off and on several times, fussing at the breast. Mom didn't want to wear the baby out and stated she was wore out. I gave formula in cup and mom calmed. Encouraged mom that I was hopeful that the NS would work in getting the baby to latch and gets moms colostrum. Mom stated she was stressed d/t not expecting to have the baby so soon and she didn't want to end up pumping all the time, she wants to feed baby on the breast. Encouraged to call to next feeding to watch mom latch baby to breast using NS. Fitted #20 to Rt. Nipple and #24 NS to Lt. Nipple.  Patient Name: Sharon Burnett [id number] Date: 06/10/2014 Reason for consult: Follow-up assessment;Difficult latch;Late preterm infant;Other (Comment) (low blood sugars)   Maternal Data    Feeding Feeding Type: Breast Fed Length of feed: 15 min  LATCH Score/Interventions Latch: Repeated attempts needed to sustain latch, nipple held in mouth throughout feeding, stimulation needed to elicit sucking reflex. Intervention(s): Skin to skin;Teach feeding cues Intervention(s): Adjust position;Assist with latch;Breast massage;Breast compression  Audible Swallowing: A few with stimulation Intervention(s): Skin to skin;Hand expression  Type of Nipple: Everted at rest and after stimulation (short shaft) Intervention(s): Shells;Double electric pump  Comfort (Breast/Nipple): Filling, red/small blisters or bruises, mild/mod discomfort (slightly pink,tender)  Problem noted: Mild/Moderate discomfort Interventions (Mild/moderate discomfort): Hand expression;Comfort gels  Hold (Positioning): Assistance needed to correctly position infant at breast and maintain latch. Intervention(s): Breastfeeding basics reviewed;Support Pillows;Position options;Skin to skin  LATCH Score: 6  Lactation Tools Discussed/Used Tools: Shells;Nipple Shields;Pump;Comfort gels;Feeding cup Nipple shield size: 20;24 Shell Type: Inverted Breast pump type: Double-Electric Breast Pump   Consult Status Consult Status: Follow-up Date: 06/10/14 Follow-up type: In-patient    Lekeith Wulf, Diamond Nickel 06/10/2014, 2:36 AM

## 2014-06-10 NOTE — Discharge Summary (Signed)
Obstetric Discharge Summary Reason for Admission: induction of labor Prenatal Procedures: none Intrapartum Procedures: spontaneous vaginal delivery Postpartum Procedures: none Complications-Operative and Postpartum: 2nd degree perineal laceration Hemoglobin  Date Value Ref Range Status  06/09/2014 10.7* 12.0 - 15.0 g/dL Final     HCT  Date Value Ref Range Status  06/09/2014 31.9* 36.0 - 46.0 % Final    Physical Exam:  General: alert and no distress Lochia: appropriate Uterine Fundus: firm  Discharge Diagnoses: Term Pregnancy-delivered  Discharge Information: Date: 06/10/2014 Activity: pelvic rest Diet: routine Medications: PNV, Ibuprofen and Percocet Condition: stable Instructions: refer to practice specific booklet Discharge to: home Follow-up Information   Follow up with Bovard-Stuckert, Johnmichael Melhorn, MD. Schedule an appointment as soon as possible for a visit in 6 weeks. (for postpartum check)    Specialty:  Obstetrics and Gynecology   Contact information:   [address] [place] [address] [phone]       Newborn Data: Live born female  Birth Weight: 5 lb 11.2 oz (2586 g) APGAR: 9, 9  Home with mother.  Bovard-Stuckert, Anira Senegal 06/10/2014, 8:40 AM

## 2014-07-05 DIAGNOSIS — M79676 Pain in unspecified toe(s): Secondary | ICD-10-CM | POA: Insufficient documentation

## 2014-08-12 ENCOUNTER — Encounter (HOSPITAL_COMMUNITY): Payer: Self-pay | Admitting: *Deleted

## 2016-01-02 ENCOUNTER — Other Ambulatory Visit: Payer: Self-pay | Admitting: Obstetrics and Gynecology

## 2016-01-02 DIAGNOSIS — R928 Other abnormal and inconclusive findings on diagnostic imaging of breast: Secondary | ICD-10-CM

## 2016-01-09 ENCOUNTER — Ambulatory Visit
Admission: RE | Admit: 2016-01-09 | Discharge: 2016-01-09 | Disposition: A | Discharge status: Home / Self Care | Payer: BLUE CROSS/BLUE SHIELD | Source: Ambulatory Visit | Attending: Obstetrics and Gynecology | Admitting: Obstetrics and Gynecology

## 2016-01-09 DIAGNOSIS — R928 Other abnormal and inconclusive findings on diagnostic imaging of breast: Secondary | ICD-10-CM

## 2016-06-08 ENCOUNTER — Other Ambulatory Visit: Payer: Self-pay | Admitting: Obstetrics and Gynecology

## 2016-06-08 DIAGNOSIS — R921 Mammographic calcification found on diagnostic imaging of breast: Secondary | ICD-10-CM

## 2016-07-13 ENCOUNTER — Ambulatory Visit
Admission: RE | Admit: 2016-07-13 | Discharge: 2016-07-13 | Disposition: A | Discharge status: Home / Self Care | Payer: BLUE CROSS/BLUE SHIELD | Source: Ambulatory Visit | Attending: Obstetrics and Gynecology | Admitting: Obstetrics and Gynecology

## 2016-07-13 DIAGNOSIS — R921 Mammographic calcification found on diagnostic imaging of breast: Secondary | ICD-10-CM

## 2016-10-10 DIAGNOSIS — K219 Gastro-esophageal reflux disease without esophagitis: Secondary | ICD-10-CM | POA: Insufficient documentation

## 2017-01-31 ENCOUNTER — Other Ambulatory Visit: Payer: Self-pay | Admitting: Obstetrics and Gynecology

## 2017-01-31 DIAGNOSIS — R921 Mammographic calcification found on diagnostic imaging of breast: Secondary | ICD-10-CM

## 2017-02-07 ENCOUNTER — Ambulatory Visit
Admission: RE | Admit: 2017-02-07 | Discharge: 2017-02-07 | Disposition: A | Discharge status: Home / Self Care | Payer: BLUE CROSS/BLUE SHIELD | Source: Ambulatory Visit | Attending: Obstetrics and Gynecology | Admitting: Obstetrics and Gynecology

## 2017-02-07 DIAGNOSIS — R921 Mammographic calcification found on diagnostic imaging of breast: Secondary | ICD-10-CM

## 2017-02-28 DIAGNOSIS — M79675 Pain in left toe(s): Secondary | ICD-10-CM | POA: Insufficient documentation

## 2017-05-11 NOTE — Progress Notes (Signed)
Subjective:    Patient ID: Sharon Burnett, female    DOB: 02-17-1976, 41 y.o.   MRN: [id number] Chief Complaint  Patient presents with  . Annual Exam    CPE     HPI  Sharon Burnett is a delightful 41 yo woman who is here today for a complete physical and to establish care.  Previous care has been with gynecologist   Primary Preventative Screenings: Cervical Cancer: Pap done 01/14/17 nml with neg HPV at ob/gyn. Prior h/o slight abnml around 2014 but only had to repeat a year later. No colposcopy needed.  Family Planning: Husband had vasectomy STI screening: declines, no risk.  [facility]: [facility]  Initially did find some calcifications so had to have every 6 mos but finally cleared to change to annual but has to [facility]. Last done 4/30/20187 Tobacco use: no h/o Bone Density:  Not sure she is getting enough calcium in her diet. Does eat yogurt most days for lunch. Cardiac:  Weight/blood sugar: OTC/vit/supp/herbal: Does take vit D, flax seed oil, and a probiotic.  Diet/Exercise/EtOH/substances: Very active runner - run ~25 mi/wk. Swims. Less now that she is back to work fulltime. 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 Tdap 04/03/2014       hgb 12.7 01/18/17 CPE labs 12/2015 at ob/gyn: nml cbc, cmp, lipids (LDL 93, non-HDL 105), tsh 6.961.17, vit D LOW 27.4 [facility] Dr. Ellyn HackBovard and Dr. Senaida Oresichardson.    Overweight: BMI 28  Past Medical History:  Diagnosis Date  . Allergy   . Heart murmur   . Mitral valve prolapse   . SVD (spontaneous vaginal delivery) 06/08/2014   Past Surgical History:  Procedure Laterality Date  . WISDOM TOOTH EXTRACTION     No current outpatient prescriptions on file prior to visit.   No current facility-administered medications on file prior to visit.    No Known Allergies Family History  Problem Relation Age  of Onset  . Hyperlipidemia Mother   . Hypertension Mother   . Hyperlipidemia Father   . Heart disease Father   . Cancer Maternal Grandmother   . Hyperlipidemia Maternal Grandmother   . Hypertension Maternal Grandmother   . Heart disease Maternal Grandfather   . Heart disease Paternal Grandfather    Social History   Social History  . Marital status: Married    Spouse name: N/A  . Number of children: N/A  . Years of education: N/A   Social History Main Topics  . Smoking status: Never Smoker  . Smokeless tobacco: Never Used  . Alcohol use No  . Drug use: No  . Sexual activity: Yes    Birth control/ protection: None   Other Topics Concern  . None   Social History Narrative  . None   [facility]/12/2017  Decreased Interest 0  Down, Depressed, Hopeless 0  PHQ - 2 Score 0     Review of Systems See hpi    Objective:   Physical Exam  Constitutional: She is oriented to person, place, and time. She appears well-developed and well-nourished. No distress.  HENT:  Head: Normocephalic and atraumatic.  Right Ear: Tympanic membrane, external ear and ear canal normal.  Left Ear: Tympanic membrane, external ear and ear canal normal.  Nose: Nose normal. No mucosal edema or rhinorrhea.  Mouth/Throat: Uvula is midline, oropharynx is clear and moist and mucous membranes are normal.  No posterior oropharyngeal erythema.  Eyes: Pupils are equal, round, and reactive to light. Conjunctivae and EOM are normal. Right eye exhibits no discharge. Left eye exhibits no discharge. No scleral icterus.  Neck: Normal range of motion. Neck supple. No thyromegaly present.  Cardiovascular: Normal rate, regular rhythm, normal heart sounds and intact distal pulses.   Pulmonary/Chest: Effort normal and breath sounds normal. No respiratory distress.  Abdominal: Soft. Bowel sounds are normal. There is no tenderness.  Musculoskeletal: She exhibits no edema.  Lymphadenopathy:    She has no  cervical adenopathy.  Neurological: She is alert and oriented to person, place, and time. She has normal reflexes.  Skin: Skin is warm and dry. She is not diaphoretic. No erythema.  Psychiatric: She has a normal mood and affect. Her behavior is normal.    BP 124/80   Pulse (!) 58   Temp 98.2 F (36.8 C) (Oral)   Resp 18   Ht 5' 2.5" (1.588 m)   Wt 158 lb 3.2 oz (71.8 kg)   LMP 04/29/2017 (Exact Date)   SpO2 100%   BMI 28.47 kg/m      Assessment & Plan:  Vit D 1. Annual physical exam   2. Screening for cardiovascular, respiratory, and genitourinary diseases   3. Screening for deficiency anemia   4. Screening for thyroid disorder   5. Insomnia secondary to anxiety   6. Adjustment disorder with anxious mood   7. Vitamin D deficiency   8. Routine screening for STI (sexually transmitted infection)     Orders Placed This Encounter  Procedures  . VITAMIN D 25 Hydroxy (Vit-D Deficiency, Fractures)  . CBC  . Comprehensive metabolic panel    Order Specific Question:   Has the patient fasted?    Answer:   Yes  . TSH  . Lipid panel    Order Specific Question:   Has the patient fasted?    Answer:   Yes    Meds ordered this encounter  Medications  . doxycycline (ORACEA) 40 MG capsule    Sig: Take 40 mg by mouth every morning.  . Flaxseed, Linseed, (FLAXSEED OIL) OIL    Sig: Take 1,400 mg by mouth.  . Cholecalciferol (VITAMIN D3) 2000 units TABS    Sig: Take by mouth.  Marland Kitchen. amitriptyline (ELAVIL) 25 MG tablet    Sig: Take 1 tab by mouth 1-2 hours before bed on an empty stomach    Dispense:  30 tablet    Refill:  1     Norberto SorensonEva Pink Maye, M.D.  [facility]   [address] [place], [place] [address] [phone] phone [phone] fax  05/13/17 12:33 AM

## 2017-05-12 ENCOUNTER — Encounter: Payer: Self-pay | Admitting: Family Medicine

## 2017-05-12 ENCOUNTER — Ambulatory Visit (INDEPENDENT_AMBULATORY_CARE_PROVIDER_SITE_OTHER): Payer: BLUE CROSS/BLUE SHIELD | Admitting: Family Medicine

## 2017-05-12 VITALS — BP 124/80 | HR 58 | Temp 98.2°F | Resp 18 | Ht 62.5 in | Wt 158.2 lb

## 2017-05-12 DIAGNOSIS — Z113 Encounter for screening for infections with a predominantly sexual mode of transmission: Secondary | ICD-10-CM

## 2017-05-12 DIAGNOSIS — Z0000 Encounter for general adult medical examination without abnormal findings: Secondary | ICD-10-CM | POA: Diagnosis not present

## 2017-05-12 DIAGNOSIS — F4322 Adjustment disorder with anxiety: Secondary | ICD-10-CM | POA: Diagnosis not present

## 2017-05-12 DIAGNOSIS — Z130 Encounter for screening for diseases of the blood and blood-forming organs and certain disorders involving the immune mechanism: Secondary | ICD-10-CM | POA: Diagnosis not present

## 2017-05-12 DIAGNOSIS — Z1389 Encounter for screening for other disorder: Secondary | ICD-10-CM | POA: Diagnosis not present

## 2017-05-12 DIAGNOSIS — E559 Vitamin D deficiency, unspecified: Secondary | ICD-10-CM

## 2017-05-12 DIAGNOSIS — F5105 Insomnia due to other mental disorder: Secondary | ICD-10-CM

## 2017-05-12 DIAGNOSIS — Z1329 Encounter for screening for other suspected endocrine disorder: Secondary | ICD-10-CM

## 2017-05-12 DIAGNOSIS — F419 Anxiety disorder, unspecified: Secondary | ICD-10-CM | POA: Diagnosis not present

## 2017-05-12 DIAGNOSIS — Z136 Encounter for screening for cardiovascular disorders: Secondary | ICD-10-CM | POA: Diagnosis not present

## 2017-05-12 DIAGNOSIS — Z1383 Encounter for screening for respiratory disorder NEC: Secondary | ICD-10-CM

## 2017-05-12 MED ORDER — AMITRIPTYLINE HCL 25 MG PO TABS: ORAL_TABLET | ORAL | 1 refills | Status: DC

## 2017-05-12 NOTE — Progress Notes (Signed)
   Subjective:    Patient ID: Sharon Burnett, female    DOB: 03-Aug-1976, 41 y.o.   MRN: [id number]  HPI    Review of Systems  Psychiatric/Behavioral: Positive for sleep disturbance. The patient is nervous/anxious.        Objective:   Physical Exam        Assessment & Plan:

## 2017-05-12 NOTE — Patient Instructions (Addendum)
   IF you received an x-ray today, you will receive an invoice from [facility]. Please contact  Radiology at [phone] with questions or concerns regarding your invoice.   IF you received labwork today, you will receive an invoice from [facility]. Please contact [facility] at [phone] with questions or concerns regarding your invoice.   Our billing staff will not be able to assist you with questions regarding bills from these companies.  You will be contacted with the lab results as soon as they are available. The fastest way to get your results is to activate your My Chart account. Instructions are located on the last page of this paperwork. If you have not heard from us regarding the results in 2 weeks, please contact this office.     Living With Anxiety After being diagnosed with an anxiety disorder, you may be relieved to know why you have felt or behaved a certain way. It is natural to also feel overwhelmed about the treatment ahead and what it will mean for your life. With care and support, you can manage this condition and recover from it. How to cope with anxiety Dealing with stress Stress is your body's reaction to life changes and events, both good and bad. Stress can last just a few hours or it can be ongoing. Stress can play a major role in anxiety, so it is important to learn both how to cope with stress and how to think about it differently. Talk with your health care provider or a counselor to learn more about stress reduction. He or she may suggest some stress reduction techniques, such as:  Music therapy. This can include creating or listening to music that you enjoy and that inspires you.  Mindfulness-based meditation. This involves being aware of your normal breaths, rather than trying to control your breathing. It can be done while sitting or walking.  Centering prayer. This is a kind of meditation that involves focusing on a word, phrase, or  sacred image that is meaningful to you and that brings you peace.  Deep breathing. To do this, expand your stomach and inhale slowly through your nose. Hold your breath for 3-5 seconds. Then exhale slowly, allowing your stomach muscles to relax.  Self-talk. This is a skill where you identify thought patterns that lead to anxiety reactions and correct those thoughts.  Muscle relaxation. This involves tensing muscles then relaxing them.  Choose a stress reduction technique that fits your lifestyle and personality. Stress reduction techniques take time and practice. Set aside 5-15 minutes a day to do them. Therapists can offer training in these techniques. The training may be covered by some insurance plans. Other things you can do to manage stress include:  Keeping a stress diary. This can help you learn what triggers your stress and ways to control your response.  Thinking about how you respond to certain situations. You may not be able to control everything, but you can control your reaction.  Making time for activities that help you relax, and not feeling guilty about spending your time in this way.  Therapy combined with coping and stress-reduction skills provides the best chance for successful treatment. Medicines Medicines can help ease symptoms. Medicines for anxiety include:  Anti-anxiety drugs.  Antidepressants.  Beta-blockers.  Medicines may be used as the main treatment for anxiety disorder, along with therapy, or if other treatments are not working. Medicines should be prescribed by a health care provider. Relationships Relationships can play a big part in   helping you recover. Try to spend more time connecting with trusted friends and family members. Consider going to couples counseling, taking family education classes, or going to family therapy. Therapy can help you and others better understand the condition. How to recognize changes in your condition Everyone has a  different response to treatment for anxiety. Recovery from anxiety happens when symptoms decrease and stop interfering with your daily activities at home or work. This may mean that you will start to:  Have better concentration and focus.  Sleep better.  Be less irritable.  Have more energy.  Have improved memory.  It is important to recognize when your condition is getting worse. Contact your health care provider if your symptoms interfere with home or work and you do not feel like your condition is improving. Where to find help and support: You can get help and support from these sources:  Self-help groups.  Online and community organizations.  A trusted spiritual leader.  Couples counseling.  Family education classes.  Family therapy.  Follow these instructions at home:  Eat a healthy diet that includes plenty of vegetables, fruits, whole grains, low-fat dairy products, and lean protein. Do not eat a lot of foods that are high in solid fats, added sugars, or salt.  Exercise. Most adults should do the following: ? Exercise for at least 150 minutes each week. The exercise should increase your heart rate and make you sweat (moderate-intensity exercise). ? Strengthening exercises at least twice a week.  Cut down on caffeine, tobacco, alcohol, and other potentially harmful substances.  Get the right amount and quality of sleep. Most adults need 7-9 hours of sleep each night.  Make choices that simplify your life.  Take over-the-counter and prescription medicines only as told by your health care provider.  Avoid caffeine, alcohol, and certain over-the-counter cold medicines. These may make you feel worse. Ask your pharmacist which medicines to avoid.  Keep all follow-up visits as told by your health care provider. This is important. Questions to ask your health care provider  Would I benefit from therapy?  How often should I follow up with a health care  provider?  How long do I need to take medicine?  Are there any long-term side effects of my medicine?  Are there any alternatives to taking medicine? Contact a health care provider if:  You have a hard time staying focused or finishing daily tasks.  You spend many hours a day feeling worried about everyday life.  You become exhausted by worry.  You start to have headaches, feel tense, or have nausea.  You urinate more than normal.  You have diarrhea. Get help right away if:  You have a racing heart and shortness of breath.  You have thoughts of hurting yourself or others. If you ever feel like you may hurt yourself or others, or have thoughts about taking your own life, get help right away. You can go to your nearest emergency department or call:  Your local emergency services (911 in the [place]  A suicide crisis helpline, such as the National Suicide Prevention Lifeline at 1-800-273-8255. This is open 24-hours a day.  Summary  Taking steps to deal with stress can help calm you.  Medicines cannot cure anxiety disorders, but they can help ease symptoms.  Family, friends, and partners can play a big part in helping you recover from an anxiety disorder. This information is not intended to replace advice given to you by your health care provider. Make   sure you discuss any questions you have with your health care provider. Document Released: 09/21/2016 Document Revised: 09/21/2016 Document Reviewed: 09/21/2016 Elsevier Interactive Patient Education  2018 [employer]   Insomnia Insomnia is a sleep disorder that makes it difficult to fall asleep or to stay asleep. Insomnia can cause tiredness (fatigue), low energy, difficulty concentrating, mood swings, and poor performance at work or school. There are three different ways to classify insomnia:  Difficulty falling asleep.  Difficulty staying asleep.  Waking up too early in the morning.  Any type of insomnia can be  long-term (chronic) or short-term (acute). Both are common. Short-term insomnia usually lasts for three months or less. Chronic insomnia occurs at least three times a week for longer than three months. What are the causes? Insomnia may be caused by another condition, situation, or substance, such as:  Anxiety.  Certain medicines.  Gastroesophageal reflux disease (GERD) or other gastrointestinal conditions.  Asthma or other breathing conditions.  Restless legs syndrome, sleep apnea, or other sleep disorders.  Chronic pain.  Menopause. This may include hot flashes.  Stroke.  Abuse of alcohol, tobacco, or illegal drugs.  Depression.  Caffeine.  Neurological disorders, such as Alzheimer disease.  An overactive thyroid (hyperthyroidism).  The cause of insomnia may not be known. What increases the risk? Risk factors for insomnia include:  Gender. Women are more commonly affected than men.  Age. Insomnia is more common as you get older.  Stress. This may involve your professional or personal life.  Income. Insomnia is more common in people with lower income.  Lack of exercise.  Irregular work schedule or night shifts.  Traveling between different time zones.  What are the signs or symptoms? If you have insomnia, trouble falling asleep or trouble staying asleep is the main symptom. This may lead to other symptoms, such as:  Feeling fatigued.  Feeling nervous about going to sleep.  Not feeling rested in the morning.  Having trouble concentrating.  Feeling irritable, anxious, or depressed.  How is this treated? Treatment for insomnia depends on the cause. If your insomnia is caused by an underlying condition, treatment will focus on addressing the condition. Treatment may also include:  Medicines to help you sleep.  Counseling or therapy.  Lifestyle adjustments.  Follow these instructions at home:  Take medicines only as directed by your health care  provider.  Keep regular sleeping and waking hours. Avoid naps.  Keep a sleep diary to help you and your health care provider figure out what could be causing your insomnia. Include: ? When you sleep. ? When you wake up during the night. ? How well you sleep. ? How rested you feel the next day. ? Any side effects of medicines you are taking. ? What you eat and drink.  Make your bedroom a comfortable place where it is easy to fall asleep: ? Put up shades or special blackout curtains to block light from outside. ? Use a white noise machine to block noise. ? Keep the temperature cool.  Exercise regularly as directed by your health care provider. Avoid exercising right before bedtime.  Use relaxation techniques to manage stress. Ask your health care provider to suggest some techniques that may work well for you. These may include: ? Breathing exercises. ? Routines to release muscle tension. ? Visualizing peaceful scenes.  Cut back on alcohol, caffeinated beverages, and cigarettes, especially close to bedtime. These can disrupt your sleep.  Do not overeat or eat spicy foods right before bedtime.  This can lead to digestive discomfort that can make it hard for you to sleep.  Limit screen use before bedtime. This includes: ? Watching TV. ? Using your smartphone, tablet, and computer.  Stick to a routine. This can help you fall asleep faster. Try to do a quiet activity, brush your teeth, and go to bed at the same time each night.  Get out of bed if you are still awake after 15 minutes of trying to sleep. Keep the lights down, but try reading or doing a quiet activity. When you feel sleepy, go back to bed.  Make sure that you drive carefully. Avoid driving if you feel very sleepy.  Keep all follow-up appointments as directed by your health care provider. This is important. Contact a health care provider if:  You are tired throughout the day or have trouble in your daily routine due to  sleepiness.  You continue to have sleep problems or your sleep problems get worse. Get help right away if:  You have serious thoughts about hurting yourself or someone else. This information is not intended to replace advice given to you by your health care provider. Make sure you discuss any questions you have with your health care provider. Document Released: 09/24/2000 Document Revised: 02/27/2016 Document Reviewed: 06/28/2014 Elsevier Interactive Patient Education  [employer]2018 Elsevier Inc.

## 2017-05-13 LAB — LIPID PANEL
CHOL/HDL RATIO: 2.4 ratio (ref 0.0–4.4)
Cholesterol, Total: 173 mg/dL (ref 100–199)
HDL: 71 mg/dL (ref >39)
LDL Calculated: 92 mg/dL (ref 0–99)
Triglycerides: 48 mg/dL (ref 0–149)
VLDL Cholesterol Cal: 10 mg/dL (ref 5–40)

## 2017-05-13 LAB — COMPREHENSIVE METABOLIC PANEL
A/G RATIO: 1.8 (ref 1.2–2.2)
ALBUMIN: 4.3 g/dL (ref 3.5–5.5)
ALT: 19 IU/L (ref 0–32)
AST: 27 IU/L (ref 0–40)
Alkaline Phosphatase: 49 IU/L (ref 39–117)
BUN/Creatinine Ratio: 12 (ref 9–23)
BUN: 8 mg/dL (ref 6–24)
Bilirubin Total: 0.8 mg/dL (ref 0.0–1.2)
CALCIUM: 9.5 mg/dL (ref 8.7–10.2)
CHLORIDE: 103 mmol/L (ref 96–106)
CO2: 23 mmol/L (ref 20–29)
Creatinine, Ser: 0.69 mg/dL (ref 0.57–1.00)
GFR, EST AFRICAN AMERICAN: 125 mL/min/{1.73_m2} (ref >59)
GFR, EST NON AFRICAN AMERICAN: 108 mL/min/{1.73_m2} (ref >59)
GLOBULIN, TOTAL: 2.4 g/dL (ref 1.5–4.5)
Glucose: 83 mg/dL (ref 65–99)
Potassium: 4.6 mmol/L (ref 3.5–5.2)
SODIUM: 140 mmol/L (ref 134–144)
TOTAL PROTEIN: 6.7 g/dL (ref 6.0–8.5)

## 2017-05-13 LAB — CBC
HEMATOCRIT: 41.1 % (ref 34.0–46.6)
HEMOGLOBIN: 13.7 g/dL (ref 11.1–15.9)
MCH: 26.6 pg (ref 26.6–33.0)
MCHC: 33.3 g/dL (ref 31.5–35.7)
MCV: 80 fL (ref 79–97)
PLATELETS: 239 10*3/uL (ref 150–379)
RBC: 5.15 x10E6/uL (ref 3.77–5.28)
RDW: 14.5 % (ref 12.3–15.4)
WBC: 6.2 10*3/uL (ref 3.4–10.8)

## 2017-05-13 LAB — TSH: TSH: 1.09 u[IU]/mL (ref 0.450–4.500)

## 2017-05-13 LAB — VITAMIN D 25 HYDROXY (VIT D DEFICIENCY, FRACTURES): Vit D, 25-Hydroxy: 32.7 ng/mL (ref 30.0–100.0)

## 2017-06-09 ENCOUNTER — Encounter: Payer: Self-pay | Admitting: Family Medicine

## 2017-06-09 ENCOUNTER — Ambulatory Visit (INDEPENDENT_AMBULATORY_CARE_PROVIDER_SITE_OTHER): Payer: BLUE CROSS/BLUE SHIELD | Admitting: Family Medicine

## 2017-06-09 VITALS — BP 105/69 | HR 70 | Temp 98.7°F | Resp 16 | Ht 63.78 in | Wt 163.0 lb

## 2017-06-09 DIAGNOSIS — F411 Generalized anxiety disorder: Secondary | ICD-10-CM

## 2017-06-09 DIAGNOSIS — Z23 Encounter for immunization: Secondary | ICD-10-CM | POA: Diagnosis not present

## 2017-06-09 MED ORDER — DESVENLAFAXINE SUCCINATE ER 25 MG PO TB24: 25.0000 mg | ORAL_TABLET | Freq: Every day | ORAL | 3 refills | Status: DC

## 2017-06-09 NOTE — Patient Instructions (Addendum)
   IF you received an x-ray today, you will receive an invoice from [facility]. Please contact [facility] at [phone] with questions or concerns regarding your invoice.   IF you received labwork today, you will receive an invoice from [facility]. Please contact [facility] at [phone] with questions or concerns regarding your invoice.   Our billing staff will not be able to assist you with questions regarding bills from these companies.  You will be contacted with the lab results as soon as they are available. The fastest way to get your results is to activate your My Chart account. Instructions are located on the last page of this paperwork. If you have not heard from us regarding the results in 2 weeks, please contact this office.     Generalized Anxiety Disorder, Adult Generalized anxiety disorder (GAD) is a mental health disorder. People with this condition constantly worry about everyday events. Unlike normal anxiety, worry related to GAD is not triggered by a specific event. These worries also do not fade or get better with time. GAD interferes with life functions, including relationships, work, and school. GAD can vary from mild to severe. People with severe GAD can have intense waves of anxiety with physical symptoms (panic attacks). What are the causes? The exact cause of GAD is not known. What increases the risk? This condition is more likely to develop in:  Women.  People who have a family history of anxiety disorders.  People who are very shy.  People who experience very stressful life events, such as the death of a loved one.  People who have a very stressful family environment.  What are the signs or symptoms? People with GAD often worry excessively about many things in their lives, such as their health and family. They may also be overly concerned about:  Doing well at work.  Being on time.  Natural disasters.  Friendships.  Physical  symptoms of GAD include:  Fatigue.  Muscle tension or having muscle twitches.  Trembling or feeling shaky.  Being easily startled.  Feeling like your heart is pounding or racing.  Feeling out of breath or like you cannot take a deep breath.  Having trouble falling asleep or staying asleep.  Sweating.  Nausea, diarrhea, or irritable bowel syndrome (IBS).  Headaches.  Trouble concentrating or remembering facts.  Restlessness.  Irritability.  How is this diagnosed? Your health care provider can diagnose GAD based on your symptoms and medical history. You will also have a physical exam. The health care provider will ask specific questions about your symptoms, including how severe they are, when they started, and if they come and go. Your health care provider may ask you about your use of alcohol or drugs, including prescription medicines. Your health care provider may refer you to a mental health specialist for further evaluation. Your health care provider will do a thorough examination and may perform additional tests to rule out other possible causes of your symptoms. To be diagnosed with GAD, a person must have anxiety that:  Is out of his or her control.  Affects several different aspects of his or her life, such as work and relationships.  Causes distress that makes him or her unable to take part in normal activities.  Includes at least three physical symptoms of GAD, such as restlessness, fatigue, trouble concentrating, irritability, muscle tension, or sleep problems.  Before your health care provider can confirm a diagnosis of GAD, these symptoms must be present more days than   they are not, and they must last for six months or longer. How is this treated? The following therapies are usually used to treat GAD:  Medicine. Antidepressant medicine is usually prescribed for long-term daily control. Antianxiety medicines may be added in severe cases, especially when panic  attacks occur.  Talk therapy (psychotherapy). Certain types of talk therapy can be helpful in treating GAD by providing support, education, and guidance. Options include: ? Cognitive behavioral therapy (CBT). People learn coping skills and techniques to ease their anxiety. They learn to identify unrealistic or negative thoughts and behaviors and to replace them with positive ones. ? Acceptance and commitment therapy (ACT). This treatment teaches people how to be mindful as a way to cope with unwanted thoughts and feelings. ? Biofeedback. This process trains you to manage your body's response (physiological response) through breathing techniques and relaxation methods. You will work with a therapist while machines are used to monitor your physical symptoms.  Stress management techniques. These include yoga, meditation, and exercise.  A mental health specialist can help determine which treatment is best for you. Some people see improvement with one type of therapy. However, other people require a combination of therapies. Follow these instructions at home:  Take over-the-counter and prescription medicines only as told by your health care provider.  Try to maintain a normal routine.  Try to anticipate stressful situations and allow extra time to manage them.  Practice any stress management or self-calming techniques as taught by your health care provider.  Do not punish yourself for setbacks or for not making progress.  Try to recognize your accomplishments, even if they are small.  Keep all follow-up visits as told by your health care provider. This is important. Contact a health care provider if:  Your symptoms do not get better.  Your symptoms get worse.  You have signs of depression, such as: ? A persistently sad, cranky, or irritable mood. ? Loss of enjoyment in activities that used to bring you joy. ? Change in weight or eating. ? Changes in sleeping habits. ? Avoiding friends  or family members. ? Loss of energy for normal tasks. ? Feelings of guilt or worthlessness. Get help right away if:  You have serious thoughts about hurting yourself or others. If you ever feel like you may hurt yourself or others, or have thoughts about taking your own life, get help right away. You can go to your nearest emergency department or call:  Your local emergency services (911 in the [place]  A suicide crisis helpline, such as the National Suicide Prevention Lifeline at 1-800-273-8255. This is open 24 hours a day.  Summary  Generalized anxiety disorder (GAD) is a mental health disorder that involves worry that is not triggered by a specific event.  People with GAD often worry excessively about many things in their lives, such as their health and family.  GAD may cause physical symptoms such as restlessness, trouble concentrating, sleep problems, frequent sweating, nausea, diarrhea, headaches, and trembling or muscle twitching.  A mental health specialist can help determine which treatment is best for you. Some people see improvement with one type of therapy. However, other people require a combination of therapies. This information is not intended to replace advice given to you by your health care provider. Make sure you discuss any questions you have with your health care provider. Document Released: 01/22/2013 Document Revised: 08/17/2016 Document Reviewed: 08/17/2016 Elsevier Interactive Patient Education  2018 Elsevier Inc.  

## 2017-06-09 NOTE — Progress Notes (Signed)
Subjective:    Patient ID: Sharon Burnett, female    DOB: 16-Jul-1976, 41 y.o.   MRN: [id number]  Chief Complaint  Patient presents with  . Medication Management    follow-up on new medication started on 8/2 for Amitriptyline and pt states the medication has been helping with her sleep.   Marland Kitchen.  HPI  Last mo we started a low dose of amitriptyline. FOr a few morning she felt groggy but she is still is feeling anxiety during  41 yo starting preschool this week. 41 yo started 2nd this week. Nighttime is better and 1 tab at night.   Panic is gone at night but during the day she just feels a little tense/anxious but she has a lot going on. 35 min commute. Energy levels are good but very hard to get up in the morning but after she exercises she can get started during the day.  Work is very busy and stressful trying to balance it all.  Mom and sister has been on anti-depressants in the past.  Is able to get stuff done but it is hard for her to leave things she can't worry about.    Past Medical History:  Diagnosis Date  . Allergy   . Heart murmur   . Mitral valve prolapse   . SVD (spontaneous vaginal delivery) 06/08/2014   Past Surgical History:  Procedure Laterality Date  . WISDOM TOOTH EXTRACTION     Current Outpatient Prescriptions on File Prior to Visit  Medication Sig Dispense Refill  . amitriptyline (ELAVIL) 25 MG tablet Take 1 tab by mouth 1-2 hours before bed on an empty stomach 30 tablet 1  . Cholecalciferol (VITAMIN D3) 2000 units TABS Take by mouth.    . doxycycline (ORACEA) 40 MG capsule Take 40 mg by mouth every morning.    . Flaxseed, Linseed, (FLAXSEED OIL) OIL Take 1,400 mg by mouth.     No current facility-administered medications on file prior to visit.    No Known Allergies Family History  Problem Relation Age of Onset  . Hyperlipidemia Mother   . Hypertension Mother   . Hyperlipidemia Father   . Heart disease Father   . Cancer Maternal Grandmother   .  Hyperlipidemia Maternal Grandmother   . Hypertension Maternal Grandmother   . Heart disease Maternal Grandfather   . Heart disease Paternal Grandfather    Social History   Social History  . Marital status: Married    Spouse name: N/A  . Number of children: N/A  . Years of education: N/A   Social History Main Topics  . Smoking status: Never Smoker  . Smokeless tobacco: Never Used  . Alcohol use No  . Drug use: No  . Sexual activity: Yes    Birth control/ protection: None   Other Topics Concern  . None   Social History Narrative  . None   Depression screen [employer] 2/9 06/09/2017 05/12/2017  Decreased Interest 0 0  Down, Depressed, Hopeless 0 0  PHQ - 2 Score 0 0     Review of Systems See hpi    Objective:   Physical Exam  Constitutional: She is oriented to person, place, and time. She appears well-developed and well-nourished. No distress.  HENT:  Head: Normocephalic and atraumatic.  Right Ear: External ear normal.  Left Ear: External ear normal.  Eyes: Conjunctivae are normal. No scleral icterus.  Neck: Normal range of motion. Neck supple. No thyromegaly present.  Cardiovascular: Normal rate, regular rhythm, normal heart  sounds and intact distal pulses.   Pulmonary/Chest: Effort normal and breath sounds normal. No respiratory distress.  Musculoskeletal: She exhibits no edema.  Lymphadenopathy:    She has no cervical adenopathy.  Neurological: She is alert and oriented to person, place, and time.  Skin: Skin is warm and dry. She is not diaphoretic. No erythema.  Psychiatric: She has a normal mood and affect. Her behavior is normal.     BP 105/69   Pulse 70   Temp 98.7 F (37.1 C) (Oral)   Resp 16   Ht 5' 3.78" (1.62 m)   Wt 163 lb (73.9 kg)   LMP 05/25/2017 (Approximate)   SpO2 99%   BMI 28.17 kg/m   Assessment & Plan:   1. Anxiety state - sleep improved with amitriptyline - cont - refill prn. No prior trial of antidepressant - will try pristiq.  2. Need  for immunization against influenza     Orders Placed This Encounter  Procedures  . Flu Vaccine QUAD 36+ mos IM    Meds ordered this encounter  Medications  . desvenlafaxine 25 MG TB24    Sig: Take 25 mg by mouth daily. Increase to 2 tabs a day if needed after 1 month    Dispense:  30 tablet    Refill:  3     Norberto Sorenson, M.D.  Primary Care at [facility] [address] [place], [place] [address] [phone] phone [phone] fax  06/11/17 11:19 PM

## 2017-06-27 ENCOUNTER — Encounter: Payer: Self-pay | Admitting: Family Medicine

## 2017-07-07 ENCOUNTER — Telehealth: Payer: Self-pay | Admitting: Family Medicine

## 2017-07-07 NOTE — Telephone Encounter (Signed)
Pt sent a mychart message that has not been responded to 10 days ago.  Please advise asap [phone]

## 2017-07-08 ENCOUNTER — Telehealth: Payer: Self-pay | Admitting: *Deleted

## 2017-07-08 MED ORDER — VENLAFAXINE HCL ER 37.5 MG PO CP24: 37.5000 mg | ORAL_CAPSULE | Freq: Every day | ORAL | 1 refills | Status: DC

## 2017-07-08 NOTE — Telephone Encounter (Signed)
Duplicate message. 

## 2017-07-08 NOTE — Telephone Encounter (Signed)
Sent mychart response.

## 2017-07-08 NOTE — Telephone Encounter (Signed)
Dr. Clelia Croft, pt sent a mychart message and stated that without insurance, and with the coupon for Pristiq the medication is $300. She wants to know if there is a similar medication.

## 2017-07-11 ENCOUNTER — Other Ambulatory Visit: Payer: Self-pay | Admitting: Family Medicine

## 2017-07-21 ENCOUNTER — Ambulatory Visit: Payer: BLUE CROSS/BLUE SHIELD | Admitting: Family Medicine

## 2017-08-01 ENCOUNTER — Encounter: Payer: Self-pay | Admitting: Family Medicine

## 2017-08-02 ENCOUNTER — Other Ambulatory Visit: Payer: Self-pay | Admitting: Family Medicine

## 2017-08-02 MED ORDER — AMITRIPTYLINE HCL 25 MG PO TABS: ORAL_TABLET | ORAL | 1 refills | Status: DC

## 2017-08-03 MED ORDER — ESCITALOPRAM OXALATE 5 MG PO TABS: 5.0000 mg | ORAL_TABLET | Freq: Every day | ORAL | 1 refills | Status: DC

## 2017-08-09 ENCOUNTER — Other Ambulatory Visit: Payer: Self-pay | Admitting: Family Medicine

## 2017-08-11 ENCOUNTER — Ambulatory Visit: Payer: BLUE CROSS/BLUE SHIELD | Admitting: Family Medicine

## 2017-09-12 ENCOUNTER — Ambulatory Visit: Payer: BLUE CROSS/BLUE SHIELD | Admitting: Family Medicine

## 2017-09-16 ENCOUNTER — Ambulatory Visit: Payer: BLUE CROSS/BLUE SHIELD | Admitting: Family Medicine

## 2017-09-21 ENCOUNTER — Ambulatory Visit: Payer: BLUE CROSS/BLUE SHIELD | Admitting: Family Medicine

## 2017-10-05 ENCOUNTER — Other Ambulatory Visit: Payer: Self-pay | Admitting: Family Medicine

## 2017-11-04 ENCOUNTER — Other Ambulatory Visit: Payer: Self-pay | Admitting: Family Medicine

## 2017-12-08 ENCOUNTER — Other Ambulatory Visit: Payer: Self-pay | Admitting: Family Medicine

## 2017-12-09 NOTE — Telephone Encounter (Signed)
Lexapro refill request  LOV 06/09/18 with Dr. Ines BloomerShaw  [facility] at [facility] [address] - [place], [place]  [id number]

## 2017-12-26 ENCOUNTER — Other Ambulatory Visit: Payer: Self-pay | Admitting: Obstetrics and Gynecology

## 2017-12-26 DIAGNOSIS — R921 Mammographic calcification found on diagnostic imaging of breast: Secondary | ICD-10-CM

## 2018-01-08 ENCOUNTER — Other Ambulatory Visit: Payer: Self-pay | Admitting: Family Medicine

## 2018-01-09 NOTE — Telephone Encounter (Signed)
LOV 06/09/2017 with Dr. Clelia CroftShaw / Refill request for lexapro.  Last filled on 023/10/2017 #30 /

## 2018-02-07 ENCOUNTER — Other Ambulatory Visit: Payer: Self-pay | Admitting: Family Medicine

## 2018-02-08 ENCOUNTER — Ambulatory Visit
Admission: RE | Admit: 2018-02-08 | Discharge: 2018-02-08 | Disposition: A | Discharge status: Home / Self Care | Payer: BLUE CROSS/BLUE SHIELD | Source: Ambulatory Visit | Attending: Obstetrics and Gynecology | Admitting: Obstetrics and Gynecology

## 2018-02-08 DIAGNOSIS — R921 Mammographic calcification found on diagnostic imaging of breast: Secondary | ICD-10-CM

## 2018-02-08 NOTE — Telephone Encounter (Signed)
Elavil 25 mg refill request  LOV with Dr. Clelia Croft on 06/09/17.   Needs appt.  Karin Golden at [facility] [address] [place] [address]    [address] [place].

## 2018-03-11 ENCOUNTER — Other Ambulatory Visit: Payer: Self-pay | Admitting: Family Medicine

## 2018-03-18 ENCOUNTER — Encounter: Payer: Self-pay | Admitting: Family Medicine

## 2018-03-18 ENCOUNTER — Ambulatory Visit (INDEPENDENT_AMBULATORY_CARE_PROVIDER_SITE_OTHER): Payer: BLUE CROSS/BLUE SHIELD | Admitting: Family Medicine

## 2018-03-18 VITALS — BP 110/62 | HR 61 | Temp 99.1°F | Resp 16 | Ht 63.74 in | Wt 164.6 lb

## 2018-03-18 DIAGNOSIS — R030 Elevated blood-pressure reading, without diagnosis of hypertension: Secondary | ICD-10-CM | POA: Diagnosis not present

## 2018-03-18 DIAGNOSIS — F4322 Adjustment disorder with anxiety: Secondary | ICD-10-CM | POA: Diagnosis not present

## 2018-03-18 DIAGNOSIS — F99 Mental disorder, not otherwise specified: Secondary | ICD-10-CM

## 2018-03-18 DIAGNOSIS — F5105 Insomnia due to other mental disorder: Secondary | ICD-10-CM

## 2018-03-18 DIAGNOSIS — F411 Generalized anxiety disorder: Secondary | ICD-10-CM | POA: Diagnosis not present

## 2018-03-18 MED ORDER — AMITRIPTYLINE HCL 25 MG PO TABS: ORAL_TABLET | ORAL | 3 refills | Status: DC

## 2018-03-18 MED ORDER — ESCITALOPRAM OXALATE 20 MG PO TABS: 20.0000 mg | ORAL_TABLET | Freq: Every day | ORAL | 3 refills | Status: DC

## 2018-03-18 NOTE — Patient Instructions (Addendum)
   IF you received an x-ray today, you will receive an invoice from [facility]. Please contact  Radiology at [phone] with questions or concerns regarding your invoice.   IF you received labwork today, you will receive an invoice from [facility]. Please contact [facility] at [phone] with questions or concerns regarding your invoice.   Our billing staff will not be able to assist you with questions regarding bills from these companies.  You will be contacted with the lab results as soon as they are available. The fastest way to get your results is to activate your My Chart account. Instructions are located on the last page of this paperwork. If you have not heard from us regarding the results in 2 weeks, please contact this office.     Adjustment Disorder, Adult Adjustment disorder is a group of symptoms that can develop after a stressful life event, such as the loss of a job or serious physical illness. The symptoms can affect how you feel, think, and act. They may interfere with your relationships. Adjustment disorder increases your risk of suicide and substance abuse. If this disorder is not managed early, it can develop into a more serious condition, such as major depressive disorder or post-traumatic stress disorder. What are the causes? This condition happens when you have trouble recovering from or coping with a stressful life event. What increases the risk? You are more likely to develop this condition if:  You have had depression or anxiety.  You are being treated for a long-term (chronic) illness.  You are being treated for an illness that cannot be cured (terminal illness).  You have a family history of mental illness.  What are the signs or symptoms? Symptoms of this condition include:  Extreme trouble doing daily tasks, such as going to work.  Sadness, depression, or crying spells.  Worrying a lot.  Loss of enjoyment.  Change in appetite  or weight.  Feelings of loss or hopelessness.  Thoughts of suicide.  Anxiety, worry, or nervousness.  Trouble sleeping.  Avoiding family and friends.  Fighting or vandalism.  Complaining of feeling sick without being ill.  Feeling dazed or disconnected.  Nightmares.  Trouble sleeping.  Irritability.  Reckless driving.  Poor work performance.  Ignoring bills.  Symptoms of this condition start within three months of the stressful event. They do not last more than six months, unless the stressful circumstances last longer. Normal grieving after the death of a loved one is not a symptom of this condition. How is this diagnosed? To diagnose this condition, your health care provider will ask about what has happened in your life and how it has affected you. He or she may also ask about your medical history and your use of medicines, alcohol, and other substances. Your health care provider may do a physical exam and order lab tests or other studies. You may be referred to a mental health specialist. How is this treated? Treatment options for this condition include:  Counseling or talk therapy. Talk therapy is usually provided by mental health specialists.  Medicines. Certain medicines may help with depression, anxiety, and sleep.  Support groups. These offer emotional support, advice, and guidance. They are made up of people who have had similar experiences.  Observation and time. This is sometimes called "watchful waiting." In this treatment, health care providers monitor your health and behavior without other treatment. Adjustment disorder sometimes gets better on its own with time.  Follow these instructions at home:  Take   over-the-counter and prescription medicines only as told by your health care provider.  Keep all follow-up visits as told by your health care provider. This is important. Contact a health care provider if:  Your symptoms do not improve in six  months.  Your symptoms get worse. Get help right away if:  You have serious thoughts about hurting yourself or someone else. If you ever feel like you may hurt yourself or others, or have thoughts about taking your own life, get help right away. You can go to your nearest emergency department or call:  Your local emergency services (911 in the [place]  A suicide crisis helpline, such as the National Suicide Prevention Lifeline at 1-800-273-8255. This is open 24 hours a day.  Summary  Adjustment disorder is a group of symptoms that can develop after a stressful life event, such as the loss of a job or serious physical illness. The symptoms can affect how you feel, think, and act. They may interfere with your relationships.  Symptoms of this condition start within three months of the stressful event. They do not last more than six months, unless the stressful circumstances last longer.  Treatment may include talk therapy, medicines, participation in a support group, or observation to see if symptoms improve.  Contact your health care provider if your symptoms get worse or do not improve in six months.  If you ever feel like you may hurt yourself or others, or have thoughts about taking your own life, get help right away. This information is not intended to replace advice given to you by your health care provider. Make sure you discuss any questions you have with your health care provider. Document Released: 06/01/2006 Document Revised: 11/26/2016 Document Reviewed: 11/26/2016 Elsevier Interactive Patient Education  2018 Elsevier Inc.  

## 2018-03-18 NOTE — Progress Notes (Addendum)
Subjective:  By signing my name below, I, Sharon Burnett, attest that this documentation has been prepared under the direction and in the presence of Sharon SorensonEva Britanee Vanblarcom, MD Electronically Signed: Charline BillsEssence Burnett, ED Scribe 03/18/2018 at 1:49 PM.   Patient ID: Sharon Burnett, female    DOB: July 10, 1976, 42 y.o.   MRN: [id number]  Chief Complaint  Patient presents with  . Anxiety   HPI Sharon Burnett is a 42 y.o. female who presents to [facility] Care at [facility] for f/u on anxiety. Seen for CPE 10 months ago. Complaining of insomnia and generalized anxiety. Started on amitriptyline which helped her sleep and helped panic resolve but anxiety continued. Recommended she start Pristiq. No prior trial of antidepressants. Her insurance wouldn't approve that so switched to Effexor 37.5 but only took it for 1 wk as worsened insomnia despite taking it in the morning. Tried on Lexapro 5 instead.  Pt is still taking Lexapro but feels that the dose needs to be increased. States she is still experiencing some anxiety throughout the day. Reports that her and her husband recently separated in April when anxiety really began. States she has been sleeping much better but ran out of amitriptyline a few days ago.  Elevated BP Pt expresses concern with her triage BP; states it has never been this high. She has been exercising as usual but at work since she is unable to leave the children in he morning to exercise. Denies leg swelling, HA, visual disturbances, cp or sob on exertion. BP Readings from Last 3 Encounters:  03/18/18 (!) 150/89  06/09/17 105/69  05/12/17 124/80    Past Medical History:  Diagnosis Date  . Allergy   . Heart murmur   . Mitral valve prolapse   . SVD (spontaneous vaginal delivery) 06/08/2014   Current Outpatient Medications on File Prior to Visit  Medication Sig Dispense Refill  . amitriptyline (ELAVIL) 25 MG tablet TAKE 1 TABLET BY MOUTH EVERY EVENING ON AN EMPTY STOMACH 1 TO 2 HOURS BEFORE  BEDTIME office visit needed 30 tablet 0  . Cholecalciferol (VITAMIN D3) 2000 units TABS Take by mouth.    . escitalopram (LEXAPRO) 5 MG tablet TAKE ONE TABLET BY MOUTH DAILY 90 tablet 0  . Flaxseed, Linseed, (FLAXSEED OIL) OIL Take 1,400 mg by mouth.     No current facility-administered medications on file prior to visit.    Past Surgical History:  Procedure Laterality Date  . WISDOM TOOTH EXTRACTION     No Known Allergies Family History  Problem Relation Age of Onset  . Hyperlipidemia Mother   . Hypertension Mother   . Hyperlipidemia Father   . Heart disease Father   . Cancer Maternal Grandmother   . Hyperlipidemia Maternal Grandmother   . Hypertension Maternal Grandmother   . Heart disease Maternal Grandfather   . Heart disease Paternal Grandfather    Social History   Socioeconomic History  . Marital status: Married    Spouse name: Not on file  . Number of children: Not on file  . Years of education: Not on file  . Highest education level: Not on file  Occupational History  . Not on file  Social Needs  . Financial resource strain: Not on file  . Food insecurity:    Worry: Not on file    Inability: Not on file  . Transportation needs:    Medical: Not on file    Non-medical: Not on file  Tobacco Use  . Smoking status: Never Smoker  .  Smokeless tobacco: Never Used  Substance and Sexual Activity  . Alcohol use: No  . Drug use: No  . Sexual activity: Yes    Birth control/protection: None  Lifestyle  . Physical activity:    Days per week: Not on file    Minutes per session: Not on file  . Stress: Not on file  Relationships  . Social connections:    Talks on phone: Not on file    Gets together: Not on file    Attends religious service: Not on file    Active member of club or organization: Not on file    Attends meetings of clubs or organizations: Not on file    Relationship status: Not on file  Other Topics Concern  . Not on file  Social History Narrative    . Not on file   Depression screen [facility] 2/9 06/09/2017 05/12/2017  Decreased Interest 0 0  Down, Depressed, Hopeless 0 0  PHQ - 2 Score 0 0     Review of Systems  Eyes: Negative for visual disturbance.  Respiratory: Negative for shortness of breath.   Cardiovascular: Negative for chest pain and leg swelling.  Neurological: Negative for headaches.  Psychiatric/Behavioral: Negative for sleep disturbance (improved). The patient is nervous/anxious.       Objective:   Physical Exam  Constitutional: She is oriented to person, place, and time. She appears well-developed and well-nourished. No distress.  HENT:  Head: Normocephalic and atraumatic.  Eyes: Conjunctivae and EOM are normal.  Neck: Neck supple. No tracheal deviation present. No thyroid mass and no thyromegaly present.  Cardiovascular: Normal rate and regular rhythm.  Murmur heard.  Systolic (ejection, L lower sternal border) murmur is present with a grade of 2/6. Pulmonary/Chest: Effort normal and breath sounds normal. No respiratory distress.  Musculoskeletal: Normal range of motion.  Lymphadenopathy:    She has no cervical adenopathy.  Neurological: She is alert and oriented to person, place, and time.  Skin: Skin is warm and dry.  Psychiatric: She has a normal mood and affect. Her behavior is normal.  Nursing note and vitals reviewed.  BP (!) 150/89   Pulse 61   Temp 99.1 F (37.3 C) (Oral)   Resp 16   Ht 5' 3.74" (1.619 m)   Wt 164 lb 9.6 oz (74.7 kg)   LMP 02/28/2018   SpO2 100%   BMI 28.48 kg/m    Assessment & Plan:   1. Anxiety state - has been on lexapro 5 - ok to wean up to 1/2 tab a day for sev wks (=10mg ) and then can go up to 20 if she needs  2. Adjustment disorder with anxious mood   3. Insomnia due to other mental disorder -restart amitriptyline - working well - ok to increase if needed  4.      Elevated blood pressure reading without diagnosis of hypertension - due to stress, insomnia, ran out of  medication perhaps?  DASH diet, increase water, monitor outside office. No h/o prior.   Meds ordered this encounter  Medications  . escitalopram (LEXAPRO) 20 MG tablet    Sig: Take 1 tablet (20 mg total) by mouth daily.    Dispense:  90 tablet    Refill:  3  . amitriptyline (ELAVIL) 25 MG tablet    Sig: TAKE 1 TABLET BY MOUTH EVERY EVENING ON AN EMPTY STOMACH 1 TO 2 HOURS BEFORE BEDTIME    Dispense:  90 tablet    Refill:  3  I personally performed the services described in this documentation, which was scribed in my presence. The recorded information has been reviewed and considered, and addended by me as needed.   Sharon Sorenson, M.D.  Primary Care at [facility] [address] [place], [place] [address] [phone] phone [phone] fax  05/07/18 10:09 AM

## 2018-05-07 ENCOUNTER — Encounter: Payer: Self-pay | Admitting: Family Medicine

## 2018-05-07 DIAGNOSIS — F411 Generalized anxiety disorder: Secondary | ICD-10-CM | POA: Insufficient documentation

## 2018-05-07 DIAGNOSIS — F4322 Adjustment disorder with anxiety: Secondary | ICD-10-CM | POA: Insufficient documentation

## 2018-05-07 DIAGNOSIS — F5105 Insomnia due to other mental disorder: Secondary | ICD-10-CM | POA: Insufficient documentation

## 2018-05-07 DIAGNOSIS — F99 Mental disorder, not otherwise specified: Secondary | ICD-10-CM | POA: Insufficient documentation

## 2018-08-05 ENCOUNTER — Ambulatory Visit (INDEPENDENT_AMBULATORY_CARE_PROVIDER_SITE_OTHER): Payer: BLUE CROSS/BLUE SHIELD | Admitting: Physician Assistant

## 2018-08-05 DIAGNOSIS — Z23 Encounter for immunization: Secondary | ICD-10-CM

## 2019-06-12 ENCOUNTER — Telehealth (INDEPENDENT_AMBULATORY_CARE_PROVIDER_SITE_OTHER): Payer: Self-pay | Admitting: Family Medicine

## 2019-06-12 ENCOUNTER — Encounter: Payer: Self-pay | Admitting: Family Medicine

## 2019-06-12 ENCOUNTER — Other Ambulatory Visit: Payer: Self-pay

## 2019-06-12 DIAGNOSIS — F411 Generalized anxiety disorder: Secondary | ICD-10-CM

## 2019-06-12 MED ORDER — ESCITALOPRAM OXALATE 20 MG PO TABS: 20.0000 mg | ORAL_TABLET | Freq: Every day | ORAL | 3 refills | Status: AC

## 2019-06-12 MED ORDER — AMITRIPTYLINE HCL 25 MG PO TABS: ORAL_TABLET | ORAL | 3 refills | Status: AC

## 2019-06-12 NOTE — Progress Notes (Signed)
Virtual Visit Note  I connected with patient on 06/12/19 at 604pm by phone and verified that I am speaking with the correct person using two identifiers. Sharon Burnett is currently located at home and patient is currently with them during visit. The provider, Rutherford Guys, MD is located in their office at time of visit.  I discussed the limitations, risks, security and privacy concerns of performing an evaluation and management service by telephone and the availability of in person appointments. I also discussed with the patient that there may be a patient responsible charge related to this service. The patient expressed understanding and agreed to proceed.   CC: anxiety  HPI ? Previous PCP Dr Brigitte Pulse Last OV Aug 2019  Had Allgood in May 2020   Takes amitriptyline 25mg  at bedtime and 10mg  of lexapro in the morning - tolerating regime well She reports that anxiety has not been so well controlled since covid Tries to exercise to manage anxiety   No Known Allergies  Prior to Admission medications   Medication Sig Start Date End Date Taking? Authorizing Provider  amitriptyline (ELAVIL) 25 MG tablet TAKE 1 TABLET BY MOUTH EVERY EVENING ON AN EMPTY STOMACH 1 TO 2 HOURS BEFORE BEDTIME 03/18/18   Shawnee Knapp, MD  Cholecalciferol (VITAMIN D3) 2000 units TABS Take by mouth.    [provider]  escitalopram (LEXAPRO) 20 MG tablet Take 1 tablet (20 mg total) by mouth daily. 03/18/18   Shawnee Knapp, MD  Flaxseed, Linseed, (FLAXSEED OIL) OIL Take 1,400 mg by mouth.    [provider]    Past Medical History:  Diagnosis Date  . Allergy   . Closed fracture of phalanx of foot 03/2014   traumatic  . Heart murmur    RUSB, mid-systolic, intermittent, benign, very distant h/o cardiology w/u  . Mitral valve prolapse   . SVD (spontaneous vaginal delivery) 06/08/2014    Past Surgical History:  Procedure Laterality Date  . WISDOM TOOTH EXTRACTION      Social History   Tobacco  Use  . Smoking status: Never Smoker  . Smokeless tobacco: Never Used  Substance Use Topics  . Alcohol use: No    Family History  Problem Relation Age of Onset  . Hyperlipidemia Mother   . Hypertension Mother   . Hyperlipidemia Father   . Heart disease Father   . Cancer Maternal Grandmother   . Hyperlipidemia Maternal Grandmother   . Hypertension Maternal Grandmother   . Heart disease Maternal Grandfather   . Heart disease Paternal Grandfather     ROS Per hpi  Objective  Vitals as reported by the patient: none   ASSESSMENT and PLAN  1. GAD (generalized anxiety disorder) Not controlled. Increase lexparo to 20mg  daily. Cont with elavil at bedtime. Reviewed r/se/b. RTC precautions discussed.  Other orders - escitalopram (LEXAPRO) 20 MG tablet; Take 1 tablet (20 mg total) by mouth daily. - amitriptyline (ELAVIL) 25 MG tablet; TAKE 1 TABLET BY MOUTH EVERY EVENING ON AN EMPTY STOMACH 1 TO 2 HOURS BEFORE BEDTIME  FOLLOW-UP: 6 months   The above assessment and management plan was discussed with the patient. The patient verbalized understanding of and has agreed to the management plan. Patient is aware to call the clinic if symptoms persist or worsen. Patient is aware when to return to the clinic for a follow-up visit. Patient educated on when it is appropriate to go to the emergency department.    I provided 13 minutes of non-face-to-face  time during this encounter.  Rutherford Guys, MD Primary Care at [facility] [place], [place] [address] Ph.  [phone] Fax [phone]

## 2019-06-12 NOTE — Progress Notes (Signed)
Pt is toc from [place], needing refill on amitriptyline and lexapro. Was given the med by ob/gyn just until she was able to connect with new pcp. Pt medication and pharmacy confirmed.  She is now on rx vit d given by gyn as well. Recent labs 5/ 11/ 20. Gad 7 form completed, 12.

## 2020-05-05 ENCOUNTER — Encounter: Payer: Self-pay | Admitting: Family Medicine

## 2020-05-05 ENCOUNTER — Other Ambulatory Visit: Payer: Self-pay

## 2020-05-05 ENCOUNTER — Telehealth (INDEPENDENT_AMBULATORY_CARE_PROVIDER_SITE_OTHER): Payer: Self-pay | Admitting: Family Medicine

## 2020-05-05 VITALS — Temp 100.0°F | Ht 63.0 in | Wt 165.0 lb

## 2020-05-05 DIAGNOSIS — J988 Other specified respiratory disorders: Secondary | ICD-10-CM

## 2020-05-05 DIAGNOSIS — B9789 Other viral agents as the cause of diseases classified elsewhere: Secondary | ICD-10-CM

## 2020-05-05 NOTE — Progress Notes (Signed)
Virtual Visit Note  I connected with patient on 05/05/20 at 548pm by video doximity and verified that I am speaking with the correct person using two identifiers. Sharon Burnett is currently located at home and patient is currently with them during visit. The provider, Myles Lipps, MD is located in their office at time of visit.  I discussed the limitations, risks, security and privacy concerns of performing an evaluation and management service by telephone and the availability of in person appointments. I also discussed with the patient that there may be a patient responsible charge related to this service. The patient expressed understanding and agreed to proceed.   I provided 7 minutes of non-face-to-face time during this encounter.  Chief Complaint  Patient presents with  . Headache  . Nasal Congestion    started thursday. got worse the next day and then last night.   . Fever    baby fever around 100 and had was around someone that has a sinus infection    HPI ? 5 days ago started with PND for 2 days Felt better on day 3 Then yesterday started having low grade fevers, headaches, cough, congestion Negative SOB She has been vaccinated against covid Sick contact at work - reported neg covid test   No Known Allergies  Prior to Admission medications   Medication Sig Start Date End Date Taking? Authorizing Provider  amitriptyline (ELAVIL) 25 MG tablet TAKE 1 TABLET BY MOUTH EVERY EVENING ON AN EMPTY STOMACH 1 TO 2 HOURS BEFORE BEDTIME Patient taking differently: 12.5 mg. TAKE 1 TABLET BY MOUTH EVERY EVENING ON AN EMPTY STOMACH 1 TO 2 HOURS BEFORE BEDTIME 06/12/19  Yes Myles Lipps, MD  doxycycline (ORACEA) 40 MG capsule Take 40 mg by mouth every morning. PRN   Yes [provider]  escitalopram (LEXAPRO) 20 MG tablet Take 1 tablet (20 mg total) by mouth daily. Patient taking differently: Take 10 mg by mouth daily.  06/12/19  Yes Myles Lipps, MD    Cholecalciferol (VITAMIN D3) 2000 units TABS Take by mouth. Patient not taking: Reported on 05/05/2020    [provider]    Past Medical History:  Diagnosis Date  . Allergy   . Closed fracture of phalanx of foot 03/2014   traumatic  . Heart murmur    RUSB, mid-systolic, intermittent, benign, very distant h/o cardiology w/u  . Mitral valve prolapse   . SVD (spontaneous vaginal delivery) 06/08/2014    Past Surgical History:  Procedure Laterality Date  . WISDOM TOOTH EXTRACTION      Social History   Tobacco Use  . Smoking status: Never Smoker  . Smokeless tobacco: Never Used  Substance Use Topics  . Alcohol use: No    Family History  Problem Relation Age of Onset  . Hyperlipidemia Mother   . Hypertension Mother   . Hyperlipidemia Father   . Heart disease Father   . Cancer Maternal Grandmother   . Hyperlipidemia Maternal Grandmother   . Hypertension Maternal Grandmother   . Heart disease Maternal Grandfather   . Heart disease Paternal Grandfather     ROS Per hpi  Objective  Vitals as reported by the patient:  Temperature 100 F (37.8 C), temperature source Tympanic, height 5\' 3"  (1.6 m), weight 165 lb (74.8 kg), last menstrual period 04/23/2020, unknown if currently breastfeeding.   ASSESSMENT and PLAN  1. Viral respiratory illness Discussed supportive measures, advise covid testing. Discussed OTC meds and RTC precautions  Other orders  FOLLOW-UP: prn   The above assessment and management plan was discussed with the patient. The patient verbalized understanding of and has agreed to the management plan. Patient is aware to call the clinic if symptoms persist or worsen. Patient is aware when to return to the clinic for a follow-up visit. Patient educated on when it is appropriate to go to the emergency department.     Rutherford Guys, MD Primary Care at [place], [place] [address] Ph.  [phone] Fax [phone]

## 2020-05-05 NOTE — Patient Instructions (Signed)
° ° ° °  If you have lab work done today you will be contacted with your lab results within the next 2 weeks.  If you have not heard from us then please contact us. The fastest way to get your results is to register for My Chart. ° ° °IF you received an x-ray today, you will receive an invoice from [facility]. Please contact [facility] at [phone] with questions or concerns regarding your invoice.  ° °IF you received labwork today, you will receive an invoice from [facility]. Please contact [facility] at [phone] with questions or concerns regarding your invoice.  ° °Our billing staff will not be able to assist you with questions regarding bills from these companies. ° °You will be contacted with the lab results as soon as they are available. The fastest way to get your results is to activate your My Chart account. Instructions are located on the last page of this paperwork. If you have not heard from us regarding the results in 2 weeks, please contact this office. °  ° ° ° °

## 2020-05-23 ENCOUNTER — Other Ambulatory Visit: Payer: Self-pay

## 2020-05-23 DIAGNOSIS — Z20822 Contact with and (suspected) exposure to covid-19: Secondary | ICD-10-CM

## 2020-05-24 LAB — NOVEL CORONAVIRUS, NAA: SARS-CoV-2, NAA: NOT DETECTED

## 2020-05-24 LAB — SARS-COV-2, NAA 2 DAY TAT

## 2020-09-13 ENCOUNTER — Ambulatory Visit: Payer: BC Managed Care – PPO | Attending: Internal Medicine

## 2020-09-13 DIAGNOSIS — Z23 Encounter for immunization: Secondary | ICD-10-CM

## 2020-09-13 NOTE — Progress Notes (Signed)
   Covid-19 Vaccination Clinic  Name:  Sharon Burnett    MRN: [id number] DOB: 1976/08/17  09/13/2020  Sharon Burnett was observed post Covid-19 immunization for 15 minutes without incident. She was provided with Vaccine Information Sheet and instruction to access the V-Safe system.   Sharon Burnett was instructed to call 911 with any severe reactions post vaccine: Marland Kitchen Difficulty breathing  . Swelling of face and throat  . A fast heartbeat  . A bad rash all over body  . Dizziness and weakness   Immunizations Administered    Name Date Dose VIS Date Route   Moderna COVID-19 Vaccine 09/13/2020 12:39 PM 0.5 mL 07/30/2020 Intramuscular   Manufacturer: Moderna   Lot: [id number]   NDC: 63016-010-93

## 2022-07-06 ENCOUNTER — Other Ambulatory Visit (HOSPITAL_COMMUNITY): Payer: Self-pay | Admitting: Family Medicine

## 2022-07-06 DIAGNOSIS — I341 Nonrheumatic mitral (valve) prolapse: Secondary | ICD-10-CM

## 2022-07-14 ENCOUNTER — Ambulatory Visit (HOSPITAL_COMMUNITY): Payer: BC Managed Care – PPO | Attending: Family Medicine

## 2022-07-14 DIAGNOSIS — I341 Nonrheumatic mitral (valve) prolapse: Secondary | ICD-10-CM | POA: Diagnosis not present

## 2022-07-16 LAB — ECHOCARDIOGRAM COMPLETE
AR max vel: 2.24 cm2
AV Area VTI: 2.06 cm2
AV Area mean vel: 2.01 cm2
AV Mean grad: 7.8 mmHg
AV Peak grad: 14.6 mmHg
Ao pk vel: 1.91 m/s
Area-P 1/2: 3.77 cm2
S' Lateral: 2.4 cm

## 2022-07-22 NOTE — Progress Notes (Signed)
Sharon Burnett [place] Phone: [phone] Subjective:   [name], am serving as a scribe for Dr. Hulan Saas.  I'm seeing this patient by the request  of:  Sharon Smoker, MD  CC: Right foot pain  OMV:[id number]  Sharon Burnett is a 46 y.o. female coming in with complaint of R foot pain. Patient is a runner. Patient states that for past 2 months over 2nd toe and metatarsal. Does not hurt with running but will hurt after her runs or when taking her dog for a walk. Pain is dull and achy. Pain also present when standing on bike. Using ice. History of stress fracture of great toe in L foot.       Past Medical History:  Diagnosis Date   Allergy    Closed fracture of phalanx of foot 03/2014   traumatic   Heart murmur    RUSB, mid-systolic, intermittent, benign, very distant h/o cardiology w/u   Mitral valve prolapse    SVD (spontaneous vaginal delivery) 06/08/2014   Past Surgical History:  Procedure Laterality Date   WISDOM TOOTH EXTRACTION     Social History   Socioeconomic History   Marital status: Married    Spouse name: Not on file   Number of children: Not on file   Years of education: Not on file   Highest education level: Not on file  Occupational History   Not on file  Tobacco Use   Smoking status: Never   Smokeless tobacco: Never  Substance and Sexual Activity   Alcohol use: No   Drug use: No   Sexual activity: Yes    Birth control/protection: None  Other Topics Concern   Not on file  Social History Narrative   Not on file   Social Determinants of Health   Financial Resource Strain: Not on file  Food Insecurity: Not on file  Transportation Needs: Not on file  Physical Activity: Not on file  Stress: Not on file  Social Connections: Not on file   No Known Allergies Family History  Problem Relation Age of Onset   Hyperlipidemia Mother    Hypertension Mother    Hyperlipidemia  Father    Heart disease Father    Cancer Maternal Grandmother    Hyperlipidemia Maternal Grandmother    Hypertension Maternal Grandmother    Heart disease Maternal Grandfather    Heart disease Paternal Grandfather        Current Outpatient Medications (Analgesics):    meloxicam (MOBIC) 15 MG tablet, Take 1 tablet (15 mg total) by mouth daily.   Current Outpatient Medications (Other):    amitriptyline (ELAVIL) 25 MG tablet, TAKE 1 TABLET BY MOUTH EVERY EVENING ON AN EMPTY STOMACH 1 TO 2 HOURS BEFORE BEDTIME (Patient taking differently: 12.5 mg. TAKE 1 TABLET BY MOUTH EVERY EVENING ON AN EMPTY STOMACH 1 TO 2 HOURS BEFORE BEDTIME)   Cholecalciferol (VITAMIN D3) 2000 units TABS, Take by mouth.   doxycycline (ORACEA) 40 MG capsule, Take 40 mg by mouth every morning. PRN   escitalopram (LEXAPRO) 20 MG tablet, Take 1 tablet (20 mg total) by mouth daily. (Patient taking differently: Take 10 mg by mouth daily.)   Reviewed prior external information including notes and imaging from  primary care provider As well as notes that were available from care everywhere and other healthcare systems.  Past medical history, social, surgical and family history all reviewed in electronic medical record.  No pertanent information unless  stated regarding to the chief complaint.   Review of Systems:  No headache, visual changes, nausea, vomiting, diarrhea, constipation, dizziness, abdominal pain, skin rash, fevers, chills, night sweats, weight loss, swollen lymph nodes, body aches, joint swelling, chest pain, shortness of breath, mood changes. POSITIVE muscle aches  Objective  Blood pressure 128/84, pulse (!) 57, height 5\' 3"  (1.6 m), weight 166 lb (75.3 kg), SpO2 98 %, unknown if currently breastfeeding.   General: No apparent distress alert and oriented x3 mood and affect normal, dressed appropriately.  HEENT: Pupils equal, extraocular movements intact  Respiratory: Patient's speak in full sentences and  does not appear short of breath  Cardiovascular: No lower extremity edema, non tender, no erythema  Right foot exam shows the patient does have some mild limited range of motion of the the first toe with hallux limitus.  Patient is mildly tender over the MCP joints.  Patient does have significant breakdown of the transverse arch right greater than left.  Fibular deviation of toes 2 through 4  Limited muscular skeletal ultrasound was performed and interpreted by , M  Limited ultrasound shows that patient does have hypoechoic changes within the MCP joints of the first, second and third toes significantly.  No significant bony breakdown noted noted. Impression: Synovitis of the forefoot  97110; 15 additional minutes spent for Therapeutic exercises as stated in above notes.  This included exercises focusing on stretching, strengthening, with significant focus on eccentric aspects.   Long term goals include an improvement in range of motion, strength, endurance as well as avoiding reinjury. Patient's frequency would include in 1-2 times a day, 3-5 times a week for a duration of 6-12 weeks. Exercises for the foot include:  Stretches to help lengthen the lower leg and plantar fascia areas Theraband exercises for the lower leg and ankle to help strengthen the surrounding area- dorsiflexion, plantarflexion, inversion, eversion Massage rolling on the plantar surface of the foot with a frozen bottle, tennis ball or golf ball Towel or marble pick-ups to strengthen the plantar surface of the foot Weight bearing exercises to increase balance and overall stability   Proper technique shown and discussed handout in great detail with ATC.  All questions were discussed and answered.      Impression and Recommendations:

## 2022-07-27 ENCOUNTER — Encounter: Payer: Self-pay | Admitting: Family Medicine

## 2022-07-27 ENCOUNTER — Ambulatory Visit: Payer: Self-pay

## 2022-07-27 ENCOUNTER — Ambulatory Visit: Payer: BC Managed Care – PPO | Admitting: Family Medicine

## 2022-07-27 VITALS — BP 128/84 | HR 57 | Ht 63.0 in | Wt 166.0 lb

## 2022-07-27 DIAGNOSIS — M216X1 Other acquired deformities of right foot: Secondary | ICD-10-CM

## 2022-07-27 DIAGNOSIS — M79671 Pain in right foot: Secondary | ICD-10-CM | POA: Diagnosis not present

## 2022-07-27 MED ORDER — MELOXICAM 15 MG PO TABS: 15.0000 mg | ORAL_TABLET | Freq: Every day | ORAL | 0 refills | Status: AC

## 2022-07-27 NOTE — Patient Instructions (Addendum)
[employer] Total Support Orthotics Newtons or saucony Exercises for arch Ice 20 min after running Back off to running 2x a week  Meloxicam 15mg  ofr 10 days then as needed See me again in 5-6 weeks

## 2022-07-27 NOTE — Assessment & Plan Note (Signed)
Breakdown of the transverse arch noted.  Discussed with patient icing regimen and home exercises, which activities to do and which ones to avoid.  We discussed over-the-counter shoes that I think will be beneficial.  Home exercises given.  Secondary to patient having significant synovitis of the toes we did prescribe meloxicam which I think will be beneficial as well.  Patient will increase activity otherwise slowly.  Follow-up again in 6 to 8 weeks.  If worsening pain consider injection.

## 2022-08-02 ENCOUNTER — Ambulatory Visit: Payer: BC Managed Care – PPO | Admitting: Family Medicine

## 2022-08-17 NOTE — Progress Notes (Unsigned)
[facility] [address] [place] [address] Phone: [phone] Subjective:   Sharon Burnett, am serving as a scribe for Dr. Antoine Burnett.  I'm seeing this patient by the request  of:  Sharon Hale, MD  CC: Right foot pain follow-up  LEX:[id number]  07/27/2022 Breakdown of the transverse arch noted.  Discussed with patient icing regimen and home exercises, which activities to do and which ones to avoid.  We discussed over-the-counter shoes that I think will be beneficial.  Home exercises given.  Secondary to patient having significant synovitis of the toes we did prescribe meloxicam which I think will be beneficial as well.  Patient will increase activity otherwise slowly.  Follow-up again in 6 to 8 weeks.  If worsening pain consider injection.   Update 08/18/2022 Sharon Burnett is a 46 y.o. female coming in with complaint of R foot pain.  Found to have breakdown of the transverse arch but also synovitis of the toe.  Patient was given meloxicam as well.  Patient states that she took meloxicam for 10 days and when she stopped taking it her pain returned. Pain is over top of 2nd toe. Painful to flex toe. Feels more pain now that swelling has decreased.        Past Medical History:  Diagnosis Date   Allergy    Closed fracture of phalanx of foot 03/2014   traumatic   Heart murmur    RUSB, mid-systolic, intermittent, benign, very distant h/o cardiology w/u   Mitral valve prolapse    SVD (spontaneous vaginal delivery) 06/08/2014   Past Surgical History:  Procedure Laterality Date   WISDOM TOOTH EXTRACTION     Social History   Socioeconomic History   Marital status: Married    Spouse name: Not on file   Number of children: Not on file   Years of education: Not on file   Highest education level: Not on file  Occupational History   Not on file  Tobacco Use   Smoking status: Never   Smokeless tobacco: Never  Substance and Sexual  Activity   Alcohol use: No   Drug use: No   Sexual activity: Yes    Birth control/protection: None  Other Topics Concern   Not on file  Social History Narrative   Not on file   Social Determinants of Health   Financial Resource Strain: Not on file  Food Insecurity: Not on file  Transportation Needs: Not on file  Physical Activity: Not on file  Stress: Not on file  Social Connections: Not on file   No Known Allergies Family History  Problem Relation Age of Onset   Hyperlipidemia Mother    Hypertension Mother    Hyperlipidemia Father    Heart disease Father    Cancer Maternal Grandmother    Hyperlipidemia Maternal Grandmother    Hypertension Maternal Grandmother    Heart disease Maternal Grandfather    Heart disease Paternal Grandfather        Current Outpatient Medications (Analgesics):    meloxicam (MOBIC) 15 MG tablet, Take 1 tablet (15 mg total) by mouth daily.   Current Outpatient Medications (Other):    amitriptyline (ELAVIL) 25 MG tablet, TAKE 1 TABLET BY MOUTH EVERY EVENING ON AN EMPTY STOMACH 1 TO 2 HOURS BEFORE BEDTIME (Patient taking differently: 12.5 mg. TAKE 1 TABLET BY MOUTH EVERY EVENING ON AN EMPTY STOMACH 1 TO 2 HOURS BEFORE BEDTIME)   Cholecalciferol (VITAMIN D3) 2000 units TABS, Take by  mouth.   doxycycline (ORACEA) 40 MG capsule, Take 40 mg by mouth every morning. PRN   escitalopram (LEXAPRO) 20 MG tablet, Take 1 tablet (20 mg total) by mouth daily. (Patient taking differently: Take 10 mg by mouth daily.)   Reviewed prior external information including notes and imaging from  primary care provider As well as notes that were available from care everywhere and other healthcare systems.  Past medical history, social, surgical and family history all reviewed in electronic medical record.  No pertanent information unless stated regarding to the chief complaint.   Review of Systems:  No headache, visual changes, nausea, vomiting, diarrhea,  constipation, dizziness, abdominal pain, skin rash, fevers, chills, night sweats, weight loss, swollen lymph nodes, body aches, joint swelling, chest pain, shortness of breath, mood changes. POSITIVE muscle aches  Objective  Blood pressure 122/62, pulse 65, height 5\' 3"  (1.6 m), weight 162 lb 3.2 oz (73.6 kg), SpO2 100 %, unknown if currently breastfeeding.   General: No apparent distress alert and oriented x3 mood and affect normal, dressed appropriately.  HEENT: Pupils equal, extraocular movements intact  Respiratory: Patient's speak in full sentences and does not appear short of breath  Cardiovascular: No lower extremity edema, non tender, no erythema  Right foot exam shows the patient does have a breakdown of the transverse arch noted.  Still tenderness to palpation over the second MTP.  Procedure: Real-time Ultrasound Guided Injection of right second MTP Device: GE Logiq Q7 Ultrasound guided injection is preferred based studies that show increased duration, increased effect, greater accuracy, decreased procedural pain, increased response rate, and decreased cost with ultrasound guided versus blind injection.  Verbal informed consent obtained.  Time-out conducted.  Noted no overlying erythema, induration, or other signs of local infection.  Skin prepped in a sterile fashion.  Local anesthesia: Topical Ethyl chloride.  With sterile technique and under real time ultrasound guidance: With a 25-gauge half inch needle injecting 0.5 cc of 0.5% Marcaine and 0.5 cc of Kenalog 40 mg/mL Completed without difficulty  Pain immediately resolved suggesting accurate placement of the medication.  Advised to call if fevers/chills, erythema, induration, drainage, or persistent bleeding.  Impression: Technically successful ultrasound guided injection.    Impression and Recommendations:     The above documentation has been reviewed and is accurate and complete Sharon Pulley, DO

## 2022-08-18 ENCOUNTER — Ambulatory Visit (INDEPENDENT_AMBULATORY_CARE_PROVIDER_SITE_OTHER): Payer: BC Managed Care – PPO | Admitting: Family Medicine

## 2022-08-18 ENCOUNTER — Encounter: Payer: Self-pay | Admitting: Family Medicine

## 2022-08-18 ENCOUNTER — Ambulatory Visit: Payer: Self-pay

## 2022-08-18 VITALS — BP 122/62 | HR 65 | Ht 63.0 in | Wt 162.2 lb

## 2022-08-18 DIAGNOSIS — M216X1 Other acquired deformities of right foot: Secondary | ICD-10-CM | POA: Diagnosis not present

## 2022-08-18 DIAGNOSIS — M659 Synovitis and tenosynovitis, unspecified: Secondary | ICD-10-CM

## 2022-08-18 DIAGNOSIS — M65979 Unspecified synovitis and tenosynovitis, unspecified ankle and foot: Secondary | ICD-10-CM

## 2022-08-18 NOTE — Assessment & Plan Note (Signed)
Injection given today and tolerated the procedure well, discussed icing regimen and home exercises, we did find the patient did have the synovitis still noted but was a somewhat improving but still was having pain.  I do not believe that we are missing anything such as a stress reaction.  Symptoms do not seem to correspond with a neuroma.  We discussed the patient in 2 weeks and start increasing activity with this chronic problem.  Hopefully will continue to improve.  Follow-up again in 6 to 8 weeks

## 2022-08-18 NOTE — Patient Instructions (Signed)
Great to see you  Tell Sharon Burnett to get off my back ;) Ok to start a walk/run protocol next week.  3 days a week but rest day in between  See me again in 6 weeks to make sure perfect

## 2022-09-08 ENCOUNTER — Ambulatory Visit: Payer: BC Managed Care – PPO | Admitting: Family Medicine

## 2022-09-24 NOTE — Progress Notes (Unsigned)
[name] [address] [place] [address] Phone: [phone] Subjective:   Sharon Burnett, am serving as a scribe for Dr. Antoine Primas.  I'm seeing this patient by the request  of:  Shon Hale, MD  CC: foot pain   BTD:[id number]  08/18/2022 Injection given today and tolerated the procedure well, discussed icing regimen and home exercises, we did find the patient did have the synovitis still noted but was a somewhat improving but still was having pain.  I do not believe that we are missing anything such as a stress reaction.  Symptoms do not seem to correspond with a neuroma.  We discussed the patient in 2 weeks and start increasing activity with this chronic problem.  Hopefully will continue to improve.  Follow-up again in 6 to 8 weeks     Update 09/29/2022 Sharon Burnett is a 46 y.o. female coming in with complaint of R second MTP joint pain and synovitis . Patient states that she is doing a lot better. Does feel pain when she walks her dog in mornings. Wants to know if she should gradually increase exercise.     Past Medical History:  Diagnosis Date   Allergy    Closed fracture of phalanx of foot 03/2014   traumatic   Heart murmur    RUSB, mid-systolic, intermittent, benign, very distant h/o cardiology w/u   Mitral valve prolapse    SVD (spontaneous vaginal delivery) 06/08/2014   Past Surgical History:  Procedure Laterality Date   WISDOM TOOTH EXTRACTION     Social History   Socioeconomic History   Marital status: Married    Spouse name: Not on file   Number of children: Not on file   Years of education: Not on file   Highest education level: Not on file  Occupational History   Not on file  Tobacco Use   Smoking status: Never   Smokeless tobacco: Never  Substance and Sexual Activity   Alcohol use: No   Drug use: No   Sexual activity: Yes    Birth control/protection: None  Other Topics Concern   Not on file   Social History Narrative   Not on file   Social Determinants of Health   Financial Resource Strain: Not on file  Food Insecurity: Not on file  Transportation Needs: Not on file  Physical Activity: Not on file  Stress: Not on file  Social Connections: Not on file   No Known Allergies Family History  Problem Relation Age of Onset   Hyperlipidemia Mother    Hypertension Mother    Hyperlipidemia Father    Heart disease Father    Cancer Maternal Grandmother    Hyperlipidemia Maternal Grandmother    Hypertension Maternal Grandmother    Heart disease Maternal Grandfather    Heart disease Paternal Grandfather        Current Outpatient Medications (Analgesics):    meloxicam (MOBIC) 15 MG tablet, Take 1 tablet (15 mg total) by mouth daily.   Current Outpatient Medications (Other):    amitriptyline (ELAVIL) 25 MG tablet, TAKE 1 TABLET BY MOUTH EVERY EVENING ON AN EMPTY STOMACH 1 TO 2 HOURS BEFORE BEDTIME (Patient taking differently: 12.5 mg. TAKE 1 TABLET BY MOUTH EVERY EVENING ON AN EMPTY STOMACH 1 TO 2 HOURS BEFORE BEDTIME)   Cholecalciferol (VITAMIN D3) 2000 units TABS, Take by mouth.   doxycycline (ORACEA) 40 MG capsule, Take 40 mg by mouth every morning. PRN   escitalopram (LEXAPRO) 20  MG tablet, Take 1 tablet (20 mg total) by mouth daily. (Patient taking differently: Take 10 mg by mouth daily.)    Objective  Blood pressure 102/64, pulse (!) 56, height 5\' 3"  (1.6 m), weight 162 lb (73.5 kg), SpO2 97 %, unknown if currently breastfeeding.   General: No apparent distress alert and oriented x3 mood and affect normal, dressed appropriately.  HEENT: Pupils equal, extraocular movements intact  Respiratory: Patient's speak in full sentences and does not appear short of breath  Cardiovascular: No lower extremity edema, non tender, no erythema  Foot exam shows still the breakdown of the transfer arch noted.  Patient is nontender on exam today.  Limited muscular skeletal  ultrasound was performed and interpreted by , M  Limited ultrasound shows significant decrease in the hypoechoic changes of the second MTP.  Still a trace amount of bone noted in the third.  Otherwise fairly unremarkable. Impression: Improvement in synovitis of the toe    Impression and Recommendations:     The above documentation has been reviewed and is accurate and complete Antoine Primas, DO

## 2022-09-29 ENCOUNTER — Ambulatory Visit: Payer: Self-pay

## 2022-09-29 ENCOUNTER — Encounter: Payer: Self-pay | Admitting: Family Medicine

## 2022-09-29 ENCOUNTER — Ambulatory Visit (INDEPENDENT_AMBULATORY_CARE_PROVIDER_SITE_OTHER): Payer: BC Managed Care – PPO | Admitting: Family Medicine

## 2022-09-29 VITALS — BP 102/64 | HR 56 | Ht 63.0 in | Wt 162.0 lb

## 2022-09-29 DIAGNOSIS — M659 Synovitis and tenosynovitis, unspecified: Secondary | ICD-10-CM

## 2022-09-29 DIAGNOSIS — G8929 Other chronic pain: Secondary | ICD-10-CM

## 2022-09-29 DIAGNOSIS — M79674 Pain in right toe(s): Secondary | ICD-10-CM | POA: Diagnosis not present

## 2022-09-29 NOTE — Assessment & Plan Note (Signed)
Significant improvement in the second toe but does have some mild hypoechoic changes on ultrasound of the third toe.  Metatarsal pad given today and hopefully this will help with the breakdown of the transverse arch.  We discussed with patient to get a smaller size discussed avoiding to many of the heels.  Follow-up with me again in 6 to 8 weeks.

## 2022-09-29 NOTE — Patient Instructions (Addendum)
Good to see you [email or web address] for metatarsal pads See me again in 2 months
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
## 2022-12-01 ENCOUNTER — Ambulatory Visit: Payer: Self-pay

## 2022-12-01 ENCOUNTER — Ambulatory Visit: Payer: BC Managed Care – PPO | Admitting: Family Medicine

## 2022-12-01 VITALS — BP 110/74 | HR 62 | Ht 63.0 in | Wt 163.0 lb

## 2022-12-01 DIAGNOSIS — M659 Synovitis and tenosynovitis, unspecified: Secondary | ICD-10-CM

## 2022-12-01 DIAGNOSIS — M7061 Trochanteric bursitis, right hip: Secondary | ICD-10-CM | POA: Diagnosis not present

## 2022-12-01 DIAGNOSIS — M79671 Pain in right foot: Secondary | ICD-10-CM

## 2022-12-01 NOTE — Patient Instructions (Addendum)
Injection in hip today Do prescribed exercises at least 3x a week Ice and Voltaren See you again in 6-8 weeks

## 2022-12-01 NOTE — Assessment & Plan Note (Signed)
Patient given injection and tolerated the procedure well, discussed icing regimen and home exercises, discussed which activities would be beneficial and which ones would.  Discussed the importance of hip abductor strengthening.  Follow-up again in 6 to 8 weeks injection given today after evaluation.

## 2022-12-01 NOTE — Assessment & Plan Note (Signed)
Completely resolved at this time.  Does have very very mild hypoechoic changes still noted.  Could consider another injection but I think at the moment patient is doing well.

## 2022-12-09 ENCOUNTER — Encounter: Payer: Self-pay | Admitting: Family Medicine

## 2023-01-17 NOTE — Progress Notes (Unsigned)
[name] [address] [place] [address] Phone: [phone] Subjective:   Sharon Burnett, am serving as a scribe for Dr. Antoine Primas.  I'm seeing this patient by the request  of:  Shon Hale, MD  CC: Right great toe and hip pain  [id number]  12/01/2022 Patient given injection and tolerated the procedure well, discussed icing regimen and home exercises, discussed which activities would be beneficial and which ones would.  Discussed the importance of hip abductor strengthening.  Follow-up again in 6 to 8 weeks injection given today after evaluation.      Completely resolved at this time.  Does have very very mild hypoechoic changes still noted.  Could consider another injection but I think at the moment patient is doing well.      Update 01/19/2023 Sharon Burnett is a 47 y.o. female coming in with complaint of R great toe and R hip pain. Patient states that her hip pain is less than last visit. Ran this morning and had pain in GT after her runs. Worried about not wanting her pain to get back to where it was. She is running less each week.   Toe pain has improved but pain has now moved into the forefoot. Has slight pain during runs and when walking.       Past Medical History:  Diagnosis Date   Allergy    Closed fracture of phalanx of foot 03/2014   traumatic   Heart murmur    RUSB, mid-systolic, intermittent, benign, very distant h/o cardiology w/u   Mitral valve prolapse    SVD (spontaneous vaginal delivery) 06/08/2014   Past Surgical History:  Procedure Laterality Date   WISDOM TOOTH EXTRACTION     Social History   Socioeconomic History   Marital status: Married    Spouse name: Not on file   Number of children: Not on file   Years of education: Not on file   Highest education level: Not on file  Occupational History   Not on file  Tobacco Use   Smoking status: Never   Smokeless tobacco: Never  Substance  and Sexual Activity   Alcohol use: No   Drug use: No   Sexual activity: Yes    Birth control/protection: None  Other Topics Concern   Not on file  Social History Narrative   Not on file   Social Determinants of Health   Financial Resource Strain: Not on file  Food Insecurity: Not on file  Transportation Needs: Not on file  Physical Activity: Not on file  Stress: Not on file  Social Connections: Not on file   No Known Allergies Family History  Problem Relation Age of Onset   Hyperlipidemia Mother    Hypertension Mother    Hyperlipidemia Father    Heart disease Father    Cancer Maternal Grandmother    Hyperlipidemia Maternal Grandmother    Hypertension Maternal Grandmother    Heart disease Maternal Grandfather    Heart disease Paternal Grandfather        Current Outpatient Medications (Analgesics):    meloxicam (MOBIC) 15 MG tablet, Take 1 tablet (15 mg total) by mouth daily.   Current Outpatient Medications (Other):    amitriptyline (ELAVIL) 25 MG tablet, TAKE 1 TABLET BY MOUTH EVERY EVENING ON AN EMPTY STOMACH 1 TO 2 HOURS BEFORE BEDTIME (Patient taking differently: 12.5 mg. TAKE 1 TABLET BY MOUTH EVERY EVENING ON AN EMPTY STOMACH 1 TO 2 HOURS BEFORE BEDTIME)  Cholecalciferol (VITAMIN D3) 2000 units TABS, Take by mouth.   doxycycline (ORACEA) 40 MG capsule, Take 40 mg by mouth every morning. PRN   escitalopram (LEXAPRO) 20 MG tablet, Take 1 tablet (20 mg total) by mouth daily. (Patient taking differently: Take 10 mg by mouth daily.)   Objective  Blood pressure 108/70, pulse (!) 58, height 5\' 3"  (1.6 m), weight 164 lb (74.4 kg), SpO2 98 %, unknown if currently breastfeeding.   General: No apparent distress alert and oriented x3 mood and affect normal, dressed appropriately.  HEENT: Pupils equal, extraocular movements intact  Respiratory: Patient's speak in full sentences and does not appear short of breath  Cardiovascular: No lower extremity edema, non tender, no  erythema  Right hip still mildly tender to palpation.  Mild tightness with FABER test.  Negative straight leg test noted.  Foot exam shows relatively good range of motion.  Improvement in the movement of the first MTP.    Impression and Recommendations:     The above documentation has been reviewed and is accurate and complete Judi Saa, DO

## 2023-01-19 ENCOUNTER — Ambulatory Visit (INDEPENDENT_AMBULATORY_CARE_PROVIDER_SITE_OTHER): Payer: BC Managed Care – PPO | Admitting: Family Medicine

## 2023-01-19 ENCOUNTER — Ambulatory Visit: Payer: Self-pay

## 2023-01-19 ENCOUNTER — Encounter: Payer: Self-pay | Admitting: Family Medicine

## 2023-01-19 VITALS — BP 108/70 | HR 58 | Ht 63.0 in | Wt 164.0 lb

## 2023-01-19 DIAGNOSIS — M79674 Pain in right toe(s): Secondary | ICD-10-CM | POA: Diagnosis not present

## 2023-01-19 DIAGNOSIS — M7061 Trochanteric bursitis, right hip: Secondary | ICD-10-CM

## 2023-01-19 DIAGNOSIS — M216X1 Other acquired deformities of right foot: Secondary | ICD-10-CM

## 2023-01-19 DIAGNOSIS — M659 Synovitis and tenosynovitis, unspecified: Secondary | ICD-10-CM

## 2023-01-19 NOTE — Assessment & Plan Note (Signed)
Patient has been running and encouraged orthotics with metatarsal pads

## 2023-01-19 NOTE — Assessment & Plan Note (Signed)
Discussed with patient that she has made improvement already.  Would state that she is approximately 80% better.  Discussed icing regimen and home exercises.  Will be traveling.  Discussed prednisone to take on her trip if necessary.  Increase activity slowly.  Do not expect significant difficulty.

## 2023-01-19 NOTE — Assessment & Plan Note (Signed)
No significant inflammation noted today.

## 2023-01-19 NOTE — Patient Instructions (Signed)
Good to see you [place] in running shoes Try theragun to the hip after running and then ice for 10 minutes Keep wearing recovery sandals See me in 2 months

## 2023-03-23 ENCOUNTER — Ambulatory Visit: Payer: BC Managed Care – PPO | Admitting: Family Medicine

## 2023-05-18 ENCOUNTER — Ambulatory Visit: Payer: BC Managed Care – PPO | Admitting: Family Medicine

## 2023-06-22 ENCOUNTER — Ambulatory Visit: Payer: BC Managed Care – PPO | Admitting: Family Medicine

## 2023-07-18 ENCOUNTER — Ambulatory Visit: Payer: BC Managed Care – PPO | Admitting: Family Medicine

## 2023-08-22 ENCOUNTER — Ambulatory Visit: Payer: BC Managed Care – PPO | Admitting: Family Medicine

## 2023-10-18 NOTE — Progress Notes (Signed)
 Sharon Burnett [address] [place] [address] Phone: [phone]   Assessment and Plan:     1. Right hip pain 2. Greater trochanteric bursitis of right hip -Chronic with exacerbation, subsequent sports Burnett visit - Most consistent with recurrent greater trochanteric bursitis, likely flared due to side sleeping and physical activity - Start meloxicam  15 mg daily x2 weeks.  If still having pain after 2 weeks, complete 3rd-week of NSAID. May use remaining NSAID as needed once daily for pain control.  Do not to use additional over-the-counter NSAIDs (ibuprofen , naproxen, Advil , Aleve) while taking prescription NSAIDs.  May use Tylenol  [phone] mg 2 to 3 times a day for breakthrough pain. - Start HEP targeting gluteal musculature and hip flexors - X-ray obtained in clinic.  My interpretation: No acute fracture or dislocation.  Mild bilateral acetabular sclerosis -Recommend relative rest for 1 to 2 weeks, followed by gradual return to physical activity.  Start activity at 50% (speed, duration, reps, sets, intensity) and allow 24 hours to assess for worsening pain.  If 50% is well-tolerated, may increase next activity to 75%.  If 75% is well-tolerated, may increase next physical activity to 100%.  If any of these levels cause pain, recommend dropping down to previous level for an additional 2-3 attempts before advancing.  15 additional minutes spent for educating Therapeutic Home Exercise Program.  This included exercises focusing on stretching, strengthening, with focus on eccentric aspects.   Long term goals include an improvement in range of motion, strength, endurance as well as avoiding reinjury. Patient's frequency would include in 1-2 times a day, 3-5 times a week for a duration of 6-12 weeks. Proper technique shown and discussed handout in great detail with ATC.  All questions were discussed and answered.    Pertinent previous records  reviewed include none  Follow Up: 4 weeks for reevaluation.  If no improvement or worsening of symptoms, would consider greater trochanteric CSI   Subjective:   I, Sharon Burnett, am serving as a neurosurgeon for Doctor Sharon Burnett  Chief Complaint: right hip pain   HPI:   10/19/2023 Patient is a 48 year old female with hip pain. Patient states that she has had pain for years. Pain came back early December. She was running and had to cut run short. Rested and tried to run again and pain came back. Pain has resolved but she does not feel like she could run. Pain radiates to the front hip flexor area. No numbness or tingling. No meds for the pain. Is able to walk 3 miles but when running she can make it less than a mile. The pain is a tight pulling sensation   Relevant Historical Information: None pertinent  Additional pertinent review of systems negative.   Current Outpatient Medications:    amitriptyline  (ELAVIL ) 25 MG tablet, TAKE 1 TABLET BY MOUTH EVERY EVENING ON AN EMPTY STOMACH 1 TO 2 HOURS BEFORE BEDTIME (Patient taking differently: 12.5 mg. TAKE 1 TABLET BY MOUTH EVERY EVENING ON AN EMPTY STOMACH 1 TO 2 HOURS BEFORE BEDTIME), Disp: 90 tablet, Rfl: 3   Cholecalciferol (VITAMIN D3) 2000 units TABS, Take by mouth., Disp: , Rfl:    doxycycline (ORACEA) 40 MG capsule, Take 40 mg by mouth every morning. PRN, Disp: , Rfl:    escitalopram  (LEXAPRO ) 20 MG tablet, Take 1 tablet (20 mg total) by mouth daily. (Patient taking differently: Take 10 mg by mouth daily.), Disp: 90 tablet, Rfl: 3  meloxicam  (MOBIC ) 15 MG tablet, Take 1 tablet (15 mg total) by mouth daily., Disp: 30 tablet, Rfl: 0   meloxicam  (MOBIC ) 15 MG tablet, Take 1 tablet (15 mg total) by mouth daily., Disp: 30 tablet, Rfl: 0   Objective:     Vitals:   10/19/23 0959  BP: 108/72  Pulse: (!) 59  SpO2: 99%  Weight: 164 lb (74.4 kg)  Height: 5' 3 (1.6 m)      Body mass index is 29.05 kg/m.    Physical Exam:     General: awake, alert, and oriented no acute distress, nontoxic Skin: no suspicious lesions or rashes Neuro:sensation intact distally with no deficits, normal muscle tone, no atrophy, strength 5/5 in all tested lower ext groups Psych: normal mood and affect, speech clear   Right hip: No deformity, swelling or wasting ROM Flexion 90, ext 30, IR 45, ER 45 TTP hip flexors, greater trochanter, gluteal musculature NTTP over the   si joint, lumbar spine Negative log roll with FROM Negative FABER Negative FADIR Negative Piriformis test Negative trendelenberg Gait normal  2 fingerbreadths bilaterally with Debby test  Electronically signed by:  Sharon Burnett D.CLEMENTEEN [facility] 11:35 AM 10/19/23

## 2023-10-19 ENCOUNTER — Ambulatory Visit (INDEPENDENT_AMBULATORY_CARE_PROVIDER_SITE_OTHER): Payer: 59

## 2023-10-19 ENCOUNTER — Ambulatory Visit: Payer: 59 | Admitting: Sports Medicine

## 2023-10-19 VITALS — BP 108/72 | HR 59 | Ht 63.0 in | Wt 164.0 lb

## 2023-10-19 DIAGNOSIS — M7061 Trochanteric bursitis, right hip: Secondary | ICD-10-CM | POA: Diagnosis not present

## 2023-10-19 DIAGNOSIS — M25551 Pain in right hip: Secondary | ICD-10-CM

## 2023-10-19 MED ORDER — MELOXICAM 15 MG PO TABS: 15.0000 mg | ORAL_TABLET | Freq: Every day | ORAL | 0 refills | Status: AC

## 2023-10-19 NOTE — Patient Instructions (Signed)
-   Start meloxicam  15 mg daily x2 weeks.  If still having pain after 2 weeks, complete 3rd-week of NSAID. May use remaining NSAID as needed once daily for pain control.  Do not to use additional over-the-counter NSAIDs (ibuprofen , naproxen, Advil , Aleve) while taking prescription NSAIDs.  May use Tylenol  [phone] mg 2 to 3 times a day for breakthrough pain. Hip HEP   Recommend 1-2 week for relative rest   -Recommend gradual return to physical activity.  Start activity at 50% (speed, duration, reps, sets, intensity) and allow 24 hours to assess for worsening pain.  If 50% is well-tolerated, may increase next activity to 75%.  If 75% is well-tolerated, may increase next physical activity to 100%.  If any of these levels cause pain, recommend dropping down to previous level for an additional 2-3 attempts before advancing. 4 week follow up

## 2023-10-31 ENCOUNTER — Encounter: Payer: Self-pay | Admitting: Sports Medicine

## 2023-11-01 ENCOUNTER — Ambulatory Visit: Payer: BC Managed Care – PPO | Admitting: Family Medicine

## 2023-11-09 ENCOUNTER — Ambulatory Visit: Payer: BC Managed Care – PPO | Admitting: Family Medicine

## 2023-11-15 NOTE — Progress Notes (Signed)
 Ben Ahaana Rochette D.[facility]MENTEEN [facility] [address] [place] [address] Phone: [phone]   Assessment and Plan:     1. Right hip pain (Primary) 2. Greater trochanteric bursitis of right hip -Chronic with exacerbation, subsequent visit - Still consistent with recurrent greater trochanteric bursitis.  Likely flared due to side sleeping and physical activity.  Overall improvement with course of meloxicam , however symptoms continue to intermittently return to physical activity and side sleeping - Discontinue meloxicam  15 mg daily.  May use meloxicam  15 mg daily as needed for flares of pain.  Recommend limiting chronic NSAIDs to only 2 doses per week - May use p.o. Tylenol  or topical Voltaren gel as needed for day-to-day pain relief - Continue HEP - Patient elected for greater trochanteric CSI.  Tolerated well per note below  Procedure: Greater trochanteric bursal injection Side: Right  Risks explained and consent was given verbally. The site was cleaned with alcohol prep. A steroid injection was performed with patient in the lateral side-lying position at area of maximum tenderness over greater trochanter using 2mL of 1% lidocaine  without epinephrine and 1mL of kenalog 40mg /ml. This was well tolerated and resulted in symptomatic relief.  Needle was removed, hemostasis achieved, and post injection instructions were explained.  Pt was advised to call or return to clinic if these symptoms worsen or fail to improve as anticipated.     Pertinent previous records reviewed include none  Follow Up: As needed if no improvement or worsening of symptoms.  Could consider physical therapy versus ultrasound Versus prednisone course    Subjective:   I, Moenique Parris, am serving as a neurosurgeon for Doctor Morene Mace   Chief Complaint: right hip pain    HPI:    10/19/2023 Patient is a 48 year old female with hip pain. Patient states that she has had pain for years.  Pain came back early December. She was running and had to cut run short. Rested and tried to run again and pain came back. Pain has resolved but she does not feel like she could run. Pain radiates to the front hip flexor area. No numbness or tingling. No meds for the pain. Is able to walk 3 miles but when running she can make it less than a mile. The pain is a tight pulling sensation   11/16/2023 Patient states that she is bette. Went back to activity and 3rd day she had some pain, but the pain isnt bad    Relevant Historical Information: None pertinent    Additional pertinent review of systems negative.   Current Outpatient Medications:    amitriptyline  (ELAVIL ) 25 MG tablet, TAKE 1 TABLET BY MOUTH EVERY EVENING ON AN EMPTY STOMACH 1 TO 2 HOURS BEFORE BEDTIME (Patient taking differently: 12.5 mg. TAKE 1 TABLET BY MOUTH EVERY EVENING ON AN EMPTY STOMACH 1 TO 2 HOURS BEFORE BEDTIME), Disp: 90 tablet, Rfl: 3   Cholecalciferol (VITAMIN D3) 2000 units TABS, Take by mouth., Disp: , Rfl:    doxycycline (ORACEA) 40 MG capsule, Take 40 mg by mouth every morning. PRN, Disp: , Rfl:    escitalopram  (LEXAPRO ) 20 MG tablet, Take 1 tablet (20 mg total) by mouth daily. (Patient taking differently: Take 10 mg by mouth daily.), Disp: 90 tablet, Rfl: 3   meloxicam  (MOBIC ) 15 MG tablet, Take 1 tablet (15 mg total) by mouth daily., Disp: 30 tablet, Rfl: 0   meloxicam  (MOBIC ) 15 MG tablet, Take 1 tablet (15 mg total) by mouth daily.,  Disp: 30 tablet, Rfl: 0   Objective:     Vitals:   11/16/23 1301  BP: 118/82  Pulse: (!) 56  SpO2: 96%  Weight: 163 lb (73.9 kg)  Height: 5' 3 (1.6 m)      Body mass index is 28.87 kg/m.    Physical Exam:    General: awake, alert, and oriented no acute distress, nontoxic Skin: no suspicious lesions or rashes Neuro:sensation intact distally with no deficits, normal muscle tone, no atrophy, strength 5/5 in all tested lower ext groups Psych: normal mood and affect, speech  clear   Right hip: No deformity, swelling or wasting ROM Flexion 90, ext 30, IR 45, ER 45 TTP hip flexors, greater trochanter, gluteal musculature NTTP over the   si joint, lumbar spine Negative log roll with FROM Negative FABER Negative FADIR Negative Piriformis test Negative trendelenberg Gait normal  2 fingerbreadths bilaterally with Debby test    Electronically signed by:  Odis Mace D.[facility]MENTEEN [facility] 1:14 PM 11/16/23

## 2023-11-16 ENCOUNTER — Ambulatory Visit: Payer: 59 | Admitting: Sports Medicine

## 2023-11-16 VITALS — BP 118/82 | HR 56 | Ht 63.0 in | Wt 163.0 lb

## 2023-11-16 DIAGNOSIS — M7061 Trochanteric bursitis, right hip: Secondary | ICD-10-CM

## 2023-11-16 DIAGNOSIS — M25551 Pain in right hip: Secondary | ICD-10-CM

## 2023-11-16 NOTE — Patient Instructions (Signed)
 Discontinue meloxicam  use remainder as needed limit to 1-2 times per week
